# Patient Record
Sex: Male | Born: 1960 | Race: White | Hispanic: No | Marital: Married | Smoking: Current every day smoker
Health system: Southern US, Community
[De-identification: ages and names within clinical notes are randomized; demographics above are authoritative.]

## PROBLEM LIST (undated history)

## (undated) DIAGNOSIS — G4730 Sleep apnea, unspecified: Secondary | ICD-10-CM

## (undated) DIAGNOSIS — H269 Unspecified cataract: Secondary | ICD-10-CM

## (undated) DIAGNOSIS — I10 Essential (primary) hypertension: Secondary | ICD-10-CM

## (undated) DIAGNOSIS — J189 Pneumonia, unspecified organism: Secondary | ICD-10-CM

## (undated) DIAGNOSIS — E119 Type 2 diabetes mellitus without complications: Secondary | ICD-10-CM

## (undated) DIAGNOSIS — E782 Mixed hyperlipidemia: Secondary | ICD-10-CM

## (undated) DIAGNOSIS — G629 Polyneuropathy, unspecified: Secondary | ICD-10-CM

## (undated) DIAGNOSIS — Z87442 Personal history of urinary calculi: Secondary | ICD-10-CM

## (undated) DIAGNOSIS — K219 Gastro-esophageal reflux disease without esophagitis: Secondary | ICD-10-CM

## (undated) HISTORY — PX: EYE SURGERY: SHX253

## (undated) HISTORY — PX: ESOPHAGOGASTRODUODENOSCOPY: SHX1529

## (undated) HISTORY — DX: Unspecified cataract: H26.9

## (undated) HISTORY — DX: Essential (primary) hypertension: I10

## (undated) HISTORY — PX: COLONOSCOPY: SHX174

---

## 1998-03-29 ENCOUNTER — Ambulatory Visit (HOSPITAL_COMMUNITY): Admission: RE | Admit: 1998-03-29 | Discharge: 1998-03-29 | Payer: Self-pay | Admitting: Gastroenterology

## 1999-04-09 ENCOUNTER — Emergency Department (HOSPITAL_COMMUNITY): Admission: EM | Admit: 1999-04-09 | Discharge: 1999-04-09 | Payer: Self-pay | Admitting: Emergency Medicine

## 2000-12-28 ENCOUNTER — Ambulatory Visit (HOSPITAL_COMMUNITY): Admission: RE | Admit: 2000-12-28 | Discharge: 2000-12-28 | Payer: Self-pay | Admitting: Ophthalmology

## 2001-01-04 ENCOUNTER — Ambulatory Visit (HOSPITAL_COMMUNITY): Admission: RE | Admit: 2001-01-04 | Discharge: 2001-01-04 | Payer: Self-pay | Admitting: Ophthalmology

## 2001-10-17 ENCOUNTER — Encounter: Payer: Self-pay | Admitting: Emergency Medicine

## 2001-10-17 ENCOUNTER — Emergency Department (HOSPITAL_COMMUNITY): Admission: EM | Admit: 2001-10-17 | Discharge: 2001-10-17 | Payer: Self-pay | Admitting: Emergency Medicine

## 2001-11-02 ENCOUNTER — Encounter: Admission: RE | Admit: 2001-11-02 | Discharge: 2001-11-02 | Payer: Self-pay | Admitting: Urology

## 2001-11-02 ENCOUNTER — Encounter: Payer: Self-pay | Admitting: Urology

## 2001-11-03 ENCOUNTER — Ambulatory Visit (HOSPITAL_BASED_OUTPATIENT_CLINIC_OR_DEPARTMENT_OTHER): Admission: RE | Admit: 2001-11-03 | Discharge: 2001-11-03 | Payer: Self-pay | Admitting: Urology

## 2001-11-07 ENCOUNTER — Encounter: Payer: Self-pay | Admitting: Emergency Medicine

## 2001-11-07 ENCOUNTER — Emergency Department (HOSPITAL_COMMUNITY): Admission: EM | Admit: 2001-11-07 | Discharge: 2001-11-07 | Payer: Self-pay | Admitting: Emergency Medicine

## 2002-10-03 ENCOUNTER — Ambulatory Visit (HOSPITAL_COMMUNITY): Admission: RE | Admit: 2002-10-03 | Discharge: 2002-10-03 | Payer: Self-pay | Admitting: *Deleted

## 2004-11-07 ENCOUNTER — Ambulatory Visit (HOSPITAL_COMMUNITY): Admission: RE | Admit: 2004-11-07 | Discharge: 2004-11-07 | Payer: Self-pay | Admitting: Surgery

## 2004-11-07 ENCOUNTER — Ambulatory Visit (HOSPITAL_BASED_OUTPATIENT_CLINIC_OR_DEPARTMENT_OTHER): Admission: RE | Admit: 2004-11-07 | Discharge: 2004-11-07 | Payer: Self-pay | Admitting: Surgery

## 2006-04-07 ENCOUNTER — Emergency Department (HOSPITAL_COMMUNITY): Admission: EM | Admit: 2006-04-07 | Discharge: 2006-04-08 | Payer: Self-pay | Admitting: Emergency Medicine

## 2006-11-28 ENCOUNTER — Emergency Department (HOSPITAL_COMMUNITY): Admission: EM | Admit: 2006-11-28 | Discharge: 2006-11-28 | Payer: Self-pay | Admitting: Family Medicine

## 2006-12-01 ENCOUNTER — Encounter: Admission: RE | Admit: 2006-12-01 | Discharge: 2006-12-01 | Payer: Self-pay | Admitting: Sports Medicine

## 2007-01-18 ENCOUNTER — Ambulatory Visit: Payer: Self-pay | Admitting: Gastroenterology

## 2007-01-19 ENCOUNTER — Ambulatory Visit: Payer: Self-pay | Admitting: Gastroenterology

## 2007-01-19 ENCOUNTER — Encounter (INDEPENDENT_AMBULATORY_CARE_PROVIDER_SITE_OTHER): Payer: Self-pay | Admitting: Specialist

## 2007-10-23 ENCOUNTER — Emergency Department (HOSPITAL_COMMUNITY): Admission: EM | Admit: 2007-10-23 | Discharge: 2007-10-23 | Payer: Self-pay | Admitting: Family Medicine

## 2008-02-27 ENCOUNTER — Emergency Department (HOSPITAL_COMMUNITY): Admission: EM | Admit: 2008-02-27 | Discharge: 2008-02-27 | Payer: Self-pay | Admitting: Family Medicine

## 2008-11-26 ENCOUNTER — Inpatient Hospital Stay (HOSPITAL_COMMUNITY): Admission: EM | Admit: 2008-11-26 | Discharge: 2008-11-29 | Payer: Self-pay | Admitting: Family Medicine

## 2010-10-20 ENCOUNTER — Emergency Department (HOSPITAL_COMMUNITY)
Admission: EM | Admit: 2010-10-20 | Discharge: 2010-10-21 | Payer: Self-pay | Source: Home / Self Care | Admitting: Emergency Medicine

## 2010-10-21 ENCOUNTER — Inpatient Hospital Stay (HOSPITAL_COMMUNITY)
Admission: EM | Admit: 2010-10-21 | Discharge: 2010-10-24 | Payer: Self-pay | Source: Home / Self Care | Attending: Internal Medicine | Admitting: Internal Medicine

## 2010-10-28 LAB — DIFFERENTIAL
Basophils Absolute: 0 10*3/uL (ref 0.0–0.1)
Basophils Absolute: 0 10*3/uL (ref 0.0–0.1)
Basophils Relative: 0 % (ref 0–1)
Basophils Relative: 0 % (ref 0–1)
Eosinophils Absolute: 0.7 10*3/uL (ref 0.0–0.7)
Eosinophils Absolute: 0 10*3/uL (ref 0.0–0.7)
Eosinophils Relative: 5 % (ref 0–5)
Eosinophils Relative: 0 % (ref 0–5)
Lymphocytes Relative: 20 % (ref 12–46)
Lymphocytes Relative: 11 % — ABNORMAL LOW (ref 12–46)
Lymphs Abs: 2.9 10*3/uL (ref 0.7–4.0)
Lymphs Abs: 1.9 10*3/uL (ref 0.7–4.0)
Monocytes Absolute: 1.9 10*3/uL — ABNORMAL HIGH (ref 0.1–1.0)
Monocytes Absolute: 1.6 10*3/uL — ABNORMAL HIGH (ref 0.1–1.0)
Monocytes Relative: 13 % — ABNORMAL HIGH (ref 3–12)
Monocytes Relative: 9 % (ref 3–12)
Neutro Abs: 8.9 10*3/uL — ABNORMAL HIGH (ref 1.7–7.7)
Neutro Abs: 14.2 10*3/uL — ABNORMAL HIGH (ref 1.7–7.7)
Neutrophils Relative %: 62 % (ref 43–77)
Neutrophils Relative %: 80 % — ABNORMAL HIGH (ref 43–77)

## 2010-10-28 LAB — COMPREHENSIVE METABOLIC PANEL
ALT: 34 U/L (ref 0–53)
ALT: 23 U/L (ref 0–53)
ALT: 17 U/L (ref 0–53)
AST: 37 U/L (ref 0–37)
AST: 40 U/L — ABNORMAL HIGH (ref 0–37)
AST: 16 U/L (ref 0–37)
Albumin: 3.7 g/dL (ref 3.5–5.2)
Albumin: 2.9 g/dL — ABNORMAL LOW (ref 3.5–5.2)
Albumin: 2.5 g/dL — ABNORMAL LOW (ref 3.5–5.2)
Alkaline Phosphatase: 77 U/L (ref 39–117)
Alkaline Phosphatase: 59 U/L (ref 39–117)
Alkaline Phosphatase: 54 U/L (ref 39–117)
BUN: 6 mg/dL (ref 6–23)
BUN: 6 mg/dL (ref 6–23)
BUN: 8 mg/dL (ref 6–23)
CO2: 27 mEq/L (ref 19–32)
CO2: 23 mEq/L (ref 19–32)
CO2: 25 mEq/L (ref 19–32)
Calcium: 9 mg/dL (ref 8.4–10.5)
Calcium: 8.4 mg/dL (ref 8.4–10.5)
Calcium: 8.6 mg/dL (ref 8.4–10.5)
Chloride: 94 mEq/L — ABNORMAL LOW (ref 96–112)
Chloride: 98 mEq/L (ref 96–112)
Chloride: 102 mEq/L (ref 96–112)
Creatinine, Ser: 0.66 mg/dL (ref 0.4–1.5)
Creatinine, Ser: 0.9 mg/dL (ref 0.4–1.5)
Creatinine, Ser: 0.75 mg/dL (ref 0.4–1.5)
GFR calc Af Amer: >60 mL/min (ref >60)
GFR calc Af Amer: >60 mL/min (ref >60)
GFR calc Af Amer: >60 mL/min (ref >60)
GFR calc non Af Amer: >60 mL/min (ref >60)
GFR calc non Af Amer: >60 mL/min (ref >60)
GFR calc non Af Amer: >60 mL/min (ref >60)
Glucose, Bld: 165 mg/dL — ABNORMAL HIGH (ref 70–99)
Glucose, Bld: 175 mg/dL — ABNORMAL HIGH (ref 70–99)
Glucose, Bld: 159 mg/dL — ABNORMAL HIGH (ref 70–99)
Potassium: 4 mEq/L (ref 3.5–5.1)
Potassium: 4.5 mEq/L (ref 3.5–5.1)
Potassium: 4 mEq/L (ref 3.5–5.1)
Sodium: 131 mEq/L — ABNORMAL LOW (ref 135–145)
Sodium: 134 mEq/L — ABNORMAL LOW (ref 135–145)
Sodium: 135 mEq/L (ref 135–145)
Total Bilirubin: 1.5 mg/dL — ABNORMAL HIGH (ref 0.3–1.2)
Total Bilirubin: 2.3 mg/dL — ABNORMAL HIGH (ref 0.3–1.2)
Total Bilirubin: 0.9 mg/dL (ref 0.3–1.2)
Total Protein: 7.1 g/dL (ref 6.0–8.3)
Total Protein: 5.5 g/dL — ABNORMAL LOW (ref 6.0–8.3)
Total Protein: 6.1 g/dL (ref 6.0–8.3)

## 2010-10-28 LAB — CBC
HCT: 42.3 % (ref 39.0–52.0)
HCT: 44.8 % (ref 39.0–52.0)
HCT: 40.9 % (ref 39.0–52.0)
HCT: 37.7 % — ABNORMAL LOW (ref 39.0–52.0)
HCT: 36.9 % — ABNORMAL LOW (ref 39.0–52.0)
Hemoglobin: 16.3 g/dL (ref 13.0–17.0)
Hemoglobin: 16.3 g/dL (ref 13.0–17.0)
Hemoglobin: 13.8 g/dL (ref 13.0–17.0)
Hemoglobin: 12.7 g/dL — ABNORMAL LOW (ref 13.0–17.0)
Hemoglobin: 12.5 g/dL — ABNORMAL LOW (ref 13.0–17.0)
MCH: 35.4 pg — ABNORMAL HIGH (ref 26.0–34.0)
MCH: 33.5 pg (ref 26.0–34.0)
MCH: 31.2 pg (ref 26.0–34.0)
MCH: 31.4 pg (ref 26.0–34.0)
MCH: 31.6 pg (ref 26.0–34.0)
MCHC: 36.1 g/dL — ABNORMAL HIGH (ref 30.0–36.0)
MCHC: 36.4 g/dL — ABNORMAL HIGH (ref 30.0–36.0)
MCHC: 33.7 g/dL (ref 30.0–36.0)
MCHC: 33.7 g/dL (ref 30.0–36.0)
MCHC: 33.9 g/dL (ref 30.0–36.0)
MCV: 91.8 fL (ref 78.0–100.0)
MCV: 92.2 fL (ref 78.0–100.0)
MCV: 92.3 fL (ref 78.0–100.0)
MCV: 93.1 fL (ref 78.0–100.0)
MCV: 93.2 fL (ref 78.0–100.0)
Platelets: 188 10*3/uL (ref 150–400)
Platelets: 193 10*3/uL (ref 150–400)
Platelets: 154 10*3/uL (ref 150–400)
Platelets: 142 10*3/uL — ABNORMAL LOW (ref 150–400)
Platelets: 185 10*3/uL (ref 150–400)
RBC: 4.61 MIL/uL (ref 4.22–5.81)
RBC: 4.86 MIL/uL (ref 4.22–5.81)
RBC: 4.43 MIL/uL (ref 4.22–5.81)
RBC: 4.05 MIL/uL — ABNORMAL LOW (ref 4.22–5.81)
RBC: 3.96 MIL/uL — ABNORMAL LOW (ref 4.22–5.81)
RDW: 13.1 % (ref 11.5–15.5)
RDW: 13.3 % (ref 11.5–15.5)
RDW: 13.3 % (ref 11.5–15.5)
RDW: 13.8 % (ref 11.5–15.5)
RDW: 13.7 % (ref 11.5–15.5)
WBC: 14.4 10*3/uL — ABNORMAL HIGH (ref 4.0–10.5)
WBC: 17.7 10*3/uL — ABNORMAL HIGH (ref 4.0–10.5)
WBC: 17.8 10*3/uL — ABNORMAL HIGH (ref 4.0–10.5)
WBC: 15 10*3/uL — ABNORMAL HIGH (ref 4.0–10.5)
WBC: 11.4 10*3/uL — ABNORMAL HIGH (ref 4.0–10.5)

## 2010-10-28 LAB — URINALYSIS, ROUTINE W REFLEX MICROSCOPIC
Bilirubin Urine: NEGATIVE
Bilirubin Urine: NEGATIVE
Hgb urine dipstick: NEGATIVE
Hgb urine dipstick: NEGATIVE
Ketones, ur: NEGATIVE mg/dL
Ketones, ur: NEGATIVE mg/dL
Nitrite: NEGATIVE
Nitrite: NEGATIVE
Protein, ur: NEGATIVE mg/dL
Protein, ur: NEGATIVE mg/dL
Specific Gravity, Urine: 1.021 (ref 1.005–1.030)
Specific Gravity, Urine: 1.014 (ref 1.005–1.030)
Urine Glucose, Fasting: 500 mg/dL — AB
Urine Glucose, Fasting: NEGATIVE mg/dL
Urobilinogen, UA: 1 mg/dL (ref 0.0–1.0)
Urobilinogen, UA: 1 mg/dL (ref 0.0–1.0)
pH: 7 (ref 5.0–8.0)
pH: 6 (ref 5.0–8.0)

## 2010-10-28 LAB — HEPATIC FUNCTION PANEL
ALT: 23 U/L (ref 0–53)
AST: 27 U/L (ref 0–37)
Albumin: 3.6 g/dL (ref 3.5–5.2)
Alkaline Phosphatase: 81 U/L (ref 39–117)
Bilirubin, Direct: 0.7 mg/dL — ABNORMAL HIGH (ref 0.0–0.3)
Indirect Bilirubin: 1 mg/dL — ABNORMAL HIGH (ref 0.3–0.9)
Total Bilirubin: 1.7 mg/dL — ABNORMAL HIGH (ref 0.3–1.2)
Total Protein: 5.4 g/dL — ABNORMAL LOW (ref 6.0–8.3)

## 2010-10-28 LAB — LIPID PANEL
Cholesterol: 331 mg/dL — ABNORMAL HIGH (ref 0–200)
HDL: 37 mg/dL — ABNORMAL LOW (ref >39)
LDL Cholesterol: UNDETERMINED mg/dL (ref 0–99)
Total CHOL/HDL Ratio: 8.9 RATIO
Triglycerides: 1064 mg/dL — ABNORMAL HIGH (ref <150)
VLDL: UNDETERMINED mg/dL (ref 0–40)

## 2010-10-28 LAB — BASIC METABOLIC PANEL
BUN: 8 mg/dL (ref 6–23)
CO2: 24 mEq/L (ref 19–32)
Calcium: 8.7 mg/dL (ref 8.4–10.5)
Chloride: 101 mEq/L (ref 96–112)
Creatinine, Ser: 0.99 mg/dL (ref 0.4–1.5)
GFR calc Af Amer: >60 mL/min (ref >60)
GFR calc non Af Amer: >60 mL/min (ref >60)
Glucose, Bld: 233 mg/dL — ABNORMAL HIGH (ref 70–99)
Potassium: 5.3 mEq/L — ABNORMAL HIGH (ref 3.5–5.1)
Sodium: 135 mEq/L (ref 135–145)

## 2010-10-28 LAB — URINE CULTURE
Colony Count: NO GROWTH
Culture  Setup Time: 201201082346
Culture: NO GROWTH

## 2010-10-28 LAB — LIPASE, BLOOD
Lipase: 294 U/L — ABNORMAL HIGH (ref 11–59)
Lipase: 349 U/L — ABNORMAL HIGH (ref 11–59)
Lipase: 93 U/L — ABNORMAL HIGH (ref 11–59)
Lipase: 26 U/L (ref 11–59)
Lipase: 30 U/L (ref 11–59)

## 2010-10-28 LAB — POTASSIUM: Potassium: 4.7 mEq/L (ref 3.5–5.1)

## 2010-12-09 ENCOUNTER — Emergency Department (HOSPITAL_COMMUNITY): Payer: Self-pay

## 2010-12-09 ENCOUNTER — Inpatient Hospital Stay (HOSPITAL_COMMUNITY)
Admission: EM | Admit: 2010-12-09 | Discharge: 2010-12-13 | DRG: 440 | Disposition: A | Discharge status: Home / Self Care | Payer: Self-pay | Attending: Internal Medicine | Admitting: Internal Medicine

## 2010-12-09 DIAGNOSIS — F1010 Alcohol abuse, uncomplicated: Secondary | ICD-10-CM | POA: Diagnosis present

## 2010-12-09 DIAGNOSIS — F17200 Nicotine dependence, unspecified, uncomplicated: Secondary | ICD-10-CM | POA: Diagnosis present

## 2010-12-09 DIAGNOSIS — IMO0001 Reserved for inherently not codable concepts without codable children: Secondary | ICD-10-CM | POA: Diagnosis present

## 2010-12-09 DIAGNOSIS — K859 Acute pancreatitis without necrosis or infection, unspecified: Principal | ICD-10-CM | POA: Diagnosis present

## 2010-12-09 DIAGNOSIS — D72829 Elevated white blood cell count, unspecified: Secondary | ICD-10-CM | POA: Diagnosis present

## 2010-12-09 DIAGNOSIS — K7689 Other specified diseases of liver: Secondary | ICD-10-CM | POA: Diagnosis present

## 2010-12-09 DIAGNOSIS — E781 Pure hyperglyceridemia: Secondary | ICD-10-CM | POA: Diagnosis present

## 2010-12-09 DIAGNOSIS — K219 Gastro-esophageal reflux disease without esophagitis: Secondary | ICD-10-CM | POA: Diagnosis present

## 2010-12-09 DIAGNOSIS — E876 Hypokalemia: Secondary | ICD-10-CM | POA: Diagnosis present

## 2010-12-09 LAB — URINALYSIS, ROUTINE W REFLEX MICROSCOPIC
Bilirubin Urine: NEGATIVE
Hgb urine dipstick: NEGATIVE
Leukocytes, UA: NEGATIVE
Specific Gravity, Urine: 1.042 — ABNORMAL HIGH (ref 1.005–1.030)
Urine Glucose, Fasting: >1000 mg/dL — AB
Urobilinogen, UA: 0.2 mg/dL (ref 0.0–1.0)
pH: 6.5 (ref 5.0–8.0)

## 2010-12-09 LAB — DIFFERENTIAL
Basophils Absolute: 0.2 10*3/uL — ABNORMAL HIGH (ref 0.0–0.1)
Eosinophils Absolute: 0.3 10*3/uL (ref 0.0–0.7)
Monocytes Absolute: 0.9 10*3/uL (ref 0.1–1.0)
Monocytes Relative: 6 % (ref 3–12)
Neutro Abs: 11.5 10*3/uL — ABNORMAL HIGH (ref 1.7–7.7)
Neutrophils Relative %: 74 % (ref 43–77)

## 2010-12-09 LAB — COMPREHENSIVE METABOLIC PANEL
AST: 38 U/L — ABNORMAL HIGH (ref 0–37)
Alkaline Phosphatase: 149 U/L — ABNORMAL HIGH (ref 39–117)
BUN: 10 mg/dL (ref 6–23)
GFR calc Af Amer: >60 mL/min (ref >60)
Glucose, Bld: 589 mg/dL (ref 70–99)
Total Bilirubin: 1.2 mg/dL (ref 0.3–1.2)

## 2010-12-09 LAB — CBC
Hemoglobin: 16.3 g/dL (ref 13.0–17.0)
MCHC: 36 g/dL (ref 30.0–36.0)
MCV: 89.5 fL (ref 78.0–100.0)
Platelets: 262 10*3/uL (ref 150–400)
WBC: 15.5 10*3/uL — ABNORMAL HIGH (ref 4.0–10.5)

## 2010-12-09 LAB — URINE MICROSCOPIC-ADD ON

## 2010-12-09 LAB — GLUCOSE, CAPILLARY: Glucose-Capillary: 277 mg/dL — ABNORMAL HIGH (ref 70–99)

## 2010-12-10 LAB — COMPREHENSIVE METABOLIC PANEL
ALT: 30 U/L (ref 0–53)
Albumin: 3.4 g/dL — ABNORMAL LOW (ref 3.5–5.2)
Alkaline Phosphatase: 97 U/L (ref 39–117)
BUN: 7 mg/dL (ref 6–23)
CO2: 27 mEq/L (ref 19–32)
GFR calc non Af Amer: >60 mL/min (ref >60)
Potassium: 4.1 mEq/L (ref 3.5–5.1)
Total Bilirubin: 1.8 mg/dL — ABNORMAL HIGH (ref 0.3–1.2)
Total Protein: 5.4 g/dL — ABNORMAL LOW (ref 6.0–8.3)

## 2010-12-10 LAB — DIFFERENTIAL
Basophils Absolute: 0 10*3/uL (ref 0.0–0.1)
Eosinophils Absolute: 0.2 10*3/uL (ref 0.0–0.7)
Eosinophils Relative: 2 % (ref 0–5)
Lymphocytes Relative: 17 % (ref 12–46)
Lymphs Abs: 2.3 10*3/uL (ref 0.7–4.0)
Monocytes Absolute: 1.1 10*3/uL — ABNORMAL HIGH (ref 0.1–1.0)
Neutro Abs: 10 10*3/uL — ABNORMAL HIGH (ref 1.7–7.7)
Neutrophils Relative %: 74 % (ref 43–77)

## 2010-12-10 LAB — GLUCOSE, CAPILLARY
Glucose-Capillary: 262 mg/dL — ABNORMAL HIGH (ref 70–99)
Glucose-Capillary: 249 mg/dL — ABNORMAL HIGH (ref 70–99)
Glucose-Capillary: 261 mg/dL — ABNORMAL HIGH (ref 70–99)

## 2010-12-10 LAB — HEMOGLOBIN A1C
Hgb A1c MFr Bld: 10.6 % — ABNORMAL HIGH (ref <5.7)
Mean Plasma Glucose: 258 mg/dL — ABNORMAL HIGH (ref <117)

## 2010-12-10 LAB — CBC
HCT: 40.4 % (ref 39.0–52.0)
Hemoglobin: 14.1 g/dL (ref 13.0–17.0)
RBC: 4.39 MIL/uL (ref 4.22–5.81)
WBC: 13.6 10*3/uL — ABNORMAL HIGH (ref 4.0–10.5)

## 2010-12-10 LAB — LIPID PANEL
Cholesterol: 415 mg/dL — ABNORMAL HIGH (ref 0–200)
LDL Cholesterol: UNDETERMINED mg/dL (ref 0–99)
Triglycerides: 2291 mg/dL — ABNORMAL HIGH (ref <150)

## 2010-12-10 LAB — LIPASE, BLOOD: Lipase: 636 U/L — ABNORMAL HIGH (ref 11–59)

## 2010-12-11 LAB — BASIC METABOLIC PANEL
BUN: 4 mg/dL — ABNORMAL LOW (ref 6–23)
Chloride: 104 mEq/L (ref 96–112)
Creatinine, Ser: 0.78 mg/dL (ref 0.4–1.5)
GFR calc Af Amer: >60 mL/min (ref >60)
GFR calc non Af Amer: >60 mL/min (ref >60)
Potassium: 3.4 mEq/L — ABNORMAL LOW (ref 3.5–5.1)

## 2010-12-11 LAB — GLUCOSE, CAPILLARY
Glucose-Capillary: 168 mg/dL — ABNORMAL HIGH (ref 70–99)
Glucose-Capillary: 173 mg/dL — ABNORMAL HIGH (ref 70–99)
Glucose-Capillary: 176 mg/dL — ABNORMAL HIGH (ref 70–99)
Glucose-Capillary: 217 mg/dL — ABNORMAL HIGH (ref 70–99)

## 2010-12-11 LAB — CBC
MCV: 91.8 fL (ref 78.0–100.0)
Platelets: 169 10*3/uL (ref 150–400)
RBC: 4.15 MIL/uL — ABNORMAL LOW (ref 4.22–5.81)
RDW: 13.1 % (ref 11.5–15.5)
WBC: 14.2 10*3/uL — ABNORMAL HIGH (ref 4.0–10.5)

## 2010-12-12 LAB — COMPREHENSIVE METABOLIC PANEL
Alkaline Phosphatase: 65 U/L (ref 39–117)
BUN: 5 mg/dL — ABNORMAL LOW (ref 6–23)
Calcium: 9 mg/dL (ref 8.4–10.5)
Creatinine, Ser: 0.75 mg/dL (ref 0.4–1.5)
Glucose, Bld: 154 mg/dL — ABNORMAL HIGH (ref 70–99)
Total Protein: 6.1 g/dL (ref 6.0–8.3)

## 2010-12-12 LAB — GLUCOSE, CAPILLARY
Glucose-Capillary: 128 mg/dL — ABNORMAL HIGH (ref 70–99)
Glucose-Capillary: 137 mg/dL — ABNORMAL HIGH (ref 70–99)
Glucose-Capillary: 176 mg/dL — ABNORMAL HIGH (ref 70–99)

## 2010-12-12 LAB — CBC
HCT: 37.6 % — ABNORMAL LOW (ref 39.0–52.0)
MCH: 31.3 pg (ref 26.0–34.0)
MCHC: 34.6 g/dL (ref 30.0–36.0)
MCV: 90.4 fL (ref 78.0–100.0)
Platelets: 180 10*3/uL (ref 150–400)
RDW: 12.9 % (ref 11.5–15.5)

## 2010-12-12 LAB — MAGNESIUM: Magnesium: 2 mg/dL (ref 1.5–2.5)

## 2010-12-12 LAB — LIPASE, BLOOD: Lipase: 30 U/L (ref 11–59)

## 2010-12-13 LAB — LIPID PANEL
Cholesterol: 269 mg/dL — ABNORMAL HIGH (ref 0–200)
HDL: 38 mg/dL — ABNORMAL LOW (ref >39)
Total CHOL/HDL Ratio: 7.1 RATIO

## 2010-12-13 LAB — CBC
HCT: 39.7 % (ref 39.0–52.0)
MCH: 31.1 pg (ref 26.0–34.0)
MCV: 90 fL (ref 78.0–100.0)
RDW: 12.7 % (ref 11.5–15.5)
WBC: 11.2 10*3/uL — ABNORMAL HIGH (ref 4.0–10.5)

## 2011-01-02 NOTE — H&P (Signed)
Matthew Mckenzie, BAUGH NO.:  [id number]  MEDICAL RECORD NO.:  [id number]           PATIENT TYPE:  E  LOCATION:  WLED                         FACILITY:  [facility]  PHYSICIAN:  Massie Maroon, MD        DATE OF BIRTH:  July 13, 1961  DATE OF ADMISSION:  12/09/2010 DATE OF DISCHARGE:                             HISTORY & PHYSICAL   CHIEF COMPLAINT:  Abdominal pain.  HISTORY OF PRESENT ILLNESS:  This 50 year old male with a history of hypertriglyceridemia, previous pancreatitis, complains of abdominal pain in the epigastric area starting this morning.  It was worse tonight and so, he presented to the ER.  He describes as a sharp pain without radiation in the epigastric area.  The patient states this pain is similar to his prior pancreatitis pain.  The patient denies any fever, chills, nausea, vomiting, diarrhea, bright red blood per rectum or black stool.  There have been no new medications other than 2 cholesterol medications.  The patient does admit to drinking Bourbon the night before.  The patient was brought to the ED and evaluate and found to have a lipase of 1515.  His liver function was slightly elevated with an AST of 38, ALT of 23, alk phos 1.9, total bilirubin 1.2.  He does have a history of gallbladder sludge on prior ultrasound and so will be receiving a ultrasound.  His glucose is also high at 589.  There was no evidence of any DKA, however.  The patient will be admitted for pancreatitis secondary to hyperglycemia and hypertriglyceridemia, rule out gallstone pancreatitis.  PAST MEDICAL HISTORY: 1. Pancreatitis. 2. Hypertriglyceridemia with triglyceride level of approximately 1000. 3. Hyperglycemia, diabetes type 2. 4. Fatty liver. 5. GERD. 6. Left inguinal hernia. 7. Restless legs. 8. Nephrolithiasis.  PAST SURGICAL HISTORY: 1. Hemorrhoidectomy. 2. Fissurectomy with lateral internal sphincterotomy. 3. Cystoscopy and lithotripsy with JJ stent  placement. 4. Also appendectomy. 5. Cataract surgery, also Lasix surgery.  SOCIAL HISTORY:  The patient smokes one-pack per day times 25 to 30 years.  He occasionally drinks.  He denies any drug use.  He is self- employed, previously he worked on boats.  He is married and lives with his wife.  He is accompanied by his sister today.  FAMILY HISTORY:  The patient's mother is alive at age 71 and is healthy. Father is alive at age 13 and also healthy with the exception of hyperlipidemia and pancreatitis.  One healthy sister and three healthy offspring.  ALLERGIES:  PERCOCET causes itching.  MEDICATIONS:  See med rec, 1. Fish oil 1 p.o. daily. 2. Pravastatin 40 mg p.o. q.h.s. 3. Gemfibrozil 600 mg p.o. b.i.d. 4. Prilosec 20 mg p.o. daily. 5. Phenergan 12.5 mg p.o. q.6h. p.r.n.  REVIEW OF SYSTEMS:  Negative for all 10 organ systems except for pertinent positives as stated above.  PHYSICAL EXAMINATION:  GENERAL:  Temperature 98.1, pulse 90, blood pressure 130/94, pulse ox is 98% on room air. HEENT:  Anicteric.  EOMI.  No nystagmus.  Pupils 1.5 mm, symmetric, direct consensual, near reflex, and intact.  Mucous membranes moist. NECK:  No JVD, no bruit, no thyromegaly, no adenopathy. HEART:  Regular rate and rhythm.  S1 and S2.  No murmurs, gallops, or rubs. LUNGS:  Clear to auscultation bilaterally. ABDOMEN:  Soft, nontender, nondistended.  Positive bowel sounds. EXTREMITIES:  No cyanosis, clubbing, or edema. SKIN:  No rash.  Negative Cullen's sign. LYMPH NODES:  No adenopathy. NEURO EXAM:  Nonfocal.  LABORATORY DATA:  WBC 15.5, hemoglobin 16.3, platelet count is 262,000. Urinalysis negative.  Sodium 129, potassium 4.2, glucose 589, BUN 10, creatinine 0.77, AST 38 (high), ALT 23, alk phos 149 (high), total bilirubin 1.2, lipase is 1515.  ASSESSMENT/PLAN: 1. PANCREATITIS (ACUTE):  The patient made n.p.o.  We will hydrate     aggressively with normal saline and control pain with  Dilaudid.  We     will check a right upper quadrant ultrasound in light of the fact     that there is abnormal liver function tests to rule out gallstone     pancreatitis. 2. ABNORMAL LIVER FUNCTION TESTS:  Check a right upper quadrant     ultrasound.  Consider checking acute hepatitis panel. 3. HYPERGLYCEMIA, NEW ONSET DIABETES, TYPE 2:  Fingerstick blood     sugars q.4h. and NovoLog sensitive sliding scale. 4. HYPERLIPIDEMIA:  Continue gemfibrozil.  Discontinue pravastatin for     now in light of abnormal liver function testing. 5. GERD:  Continue Prilosec. 6. TOBACCO DEPENDENCE:  The patient was encouraged to stop smoking.     Ten minutes spent on smoking cessation counseling. 7. DVT prophylaxis, SCDs.    Massie Maroon, MD    JYK/MEDQ  D:  12/09/2010  T:  12/09/2010  Job:  [id number]  Electronically Signed by Pearson Grippe MD on 01/02/2011 03:33:00 PM

## 2011-01-18 NOTE — Discharge Summary (Signed)
Matthew Mckenzie, Matthew Mckenzie                  ACCOUNT NO.:  [id number]  MEDICAL RECORD NO.:  [id number]           PATIENT TYPE:  I  LOCATION:  1419                         [facility]ACILITY[facility]  [facility]  PHYSICIAN:  Richarda Overlie, MD       DATE OF BIRTH:  1961-06-24  DATE OF ADMISSION:  12/09/2010 DATE OF DISCHARGE:  12/13/2010                        DISCHARGE SUMMARY - REFERRING   PRIMARY CARE PHYSICIAN:  Unassigned.  CHIEF COMPLAINT:  Abdominal pain.  DISCHARGE DIAGNOSES: 1. Acute pancreatitis. 2. Hypertriglyceridemia. 3. Leukocytosis in the setting of acute pancreatitis. 4. Alcohol abuse. 5. Nicotine dependence. 6. New onset diabetes with a hemoglobin A1c of 10.6.  DISCHARGE MEDICATIONS:  Lantus 15 units subcu daily, metformin 500 mg p.o. twice daily, nicotine patch with taper, fish oil 1 capsule p.o. daily, gemfibrozil 600 mg p.o. twice daily, Phenergan 12.5 1 tablet p.o. q.6 h. p.r.n., pravastatin 40 mg p.o. daily, Prilosec 20 mg p.o. daily.  SUBJECTIVE:  This is a 50 year old male with a history of hypertriglyceridemia previous episodes of pancreatitis who presents with epigastric pain associated with nausea and vomiting.  The patient denied any diarrhea, bright red blood per rectum.  The patient was found to have an elevated lipase of 1515, mildly elevated LFTs with an AST of 38, ALT of 23, alk phos of 1.9, T-bili of 1.2.  The patient was found to also have a high sugar of 589, but there was no evidence of DKA.  HOSPITAL COURSE: 1. Acute pancreatitis, likely in the setting of hypertriglyceridemia.     Initial lipid panel showed that his triglycerides were elevated at     2291.  Repeat triglyceride level was 605.  The patient has been on     gemfibrozil and pravastatin and most likely the acute pancreatitis     was secondary to hypertriglyceridemia in the setting of his     untreated diabetes. 2. Diabetes type 2.  The patient has a hemoglobin A1c of 10.6.  Given     his significantly  elevated hemoglobin A1c, the patient has been     started on Lantus at bedtime with insulin teaching done before     discharge as well as metformin, which will also help him with his     weight loss.  He was recommended to get a repeat hemoglobin A1c in     3 months.  LFTs and CK checked in about 6 weeks.  The patient will     get outpatient diabetes education and nutrition consult will also     be obtained prior to the patient's discharge.  DISPOSITION:  The patient's diet has been advanced to mechanical soft diet and he has tolerated this well.  He has been counseled about alcohol cessation, nicotine cessation.  He will follow up with [facility].  We will review his medications and provide any assistance that began from the hospital prior to his discharge and the plan was discussed with him.     Richarda Overlie, MD     NA/MEDQ  D:  12/13/2010  T:  12/13/2010  Job:  [id number]  Electronically Signed by Richarda Overlie MD  on 01/18/2011 08:14:56 PM

## 2011-01-28 LAB — URINALYSIS, ROUTINE W REFLEX MICROSCOPIC
Bilirubin Urine: NEGATIVE
Glucose, UA: NEGATIVE mg/dL
Hgb urine dipstick: NEGATIVE
Hgb urine dipstick: NEGATIVE
Specific Gravity, Urine: 1.028 (ref 1.005–1.030)
Specific Gravity, Urine: 1.016 (ref 1.005–1.030)
pH: 6 (ref 5.0–8.0)
pH: 7 (ref 5.0–8.0)

## 2011-01-28 LAB — HEMOGLOBIN A1C
Hgb A1c MFr Bld: 5.7 % (ref 4.6–6.1)
Mean Plasma Glucose: 117 mg/dL

## 2011-01-28 LAB — COMPREHENSIVE METABOLIC PANEL
ALT: 18 U/L (ref 0–53)
ALT: 23 U/L (ref 0–53)
AST: 34 U/L (ref 0–37)
AST: 22 U/L (ref 0–37)
Alkaline Phosphatase: 51 U/L (ref 39–117)
CO2: 23 mEq/L (ref 19–32)
CO2: 26 mEq/L (ref 19–32)
Calcium: 8.6 mg/dL (ref 8.4–10.5)
Calcium: 8.6 mg/dL (ref 8.4–10.5)
Calcium: 8.7 mg/dL (ref 8.4–10.5)
Chloride: 103 mEq/L (ref 96–112)
Chloride: 103 mEq/L (ref 96–112)
Creatinine, Ser: 1.08 mg/dL (ref 0.4–1.5)
GFR calc Af Amer: >60 mL/min (ref >60)
GFR calc non Af Amer: >60 mL/min (ref >60)
GFR calc non Af Amer: >60 mL/min (ref >60)
Glucose, Bld: 125 mg/dL — ABNORMAL HIGH (ref 70–99)
Glucose, Bld: 149 mg/dL — ABNORMAL HIGH (ref 70–99)
Glucose, Bld: 114 mg/dL — ABNORMAL HIGH (ref 70–99)
Potassium: 3.8 mEq/L (ref 3.5–5.1)
Sodium: 135 mEq/L (ref 135–145)
Sodium: 137 mEq/L (ref 135–145)
Total Bilirubin: 1.6 mg/dL — ABNORMAL HIGH (ref 0.3–1.2)
Total Bilirubin: 1.8 mg/dL — ABNORMAL HIGH (ref 0.3–1.2)
Total Protein: 6.3 g/dL (ref 6.0–8.3)

## 2011-01-28 LAB — CBC
HCT: 38.8 % — ABNORMAL LOW (ref 39.0–52.0)
HCT: 40.9 % (ref 39.0–52.0)
HCT: 37.7 % — ABNORMAL LOW (ref 39.0–52.0)
Hemoglobin: 13.7 g/dL (ref 13.0–17.0)
Hemoglobin: 15.5 g/dL (ref 13.0–17.0)
Hemoglobin: 13.2 g/dL (ref 13.0–17.0)
Hemoglobin: 13.4 g/dL (ref 13.0–17.0)
MCHC: 35.4 g/dL (ref 30.0–36.0)
MCHC: 35.4 g/dL (ref 30.0–36.0)
MCHC: 35.4 g/dL (ref 30.0–36.0)
MCV: 94.5 fL (ref 78.0–100.0)
MCV: 94.6 fL (ref 78.0–100.0)
MCV: 94.5 fL (ref 78.0–100.0)
Platelets: 195 10*3/uL (ref 150–400)
Platelets: 185 10*3/uL (ref 150–400)
RBC: 4.11 MIL/uL — ABNORMAL LOW (ref 4.22–5.81)
RBC: 4.63 MIL/uL (ref 4.22–5.81)
RBC: 3.96 MIL/uL — ABNORMAL LOW (ref 4.22–5.81)
RBC: 3.99 MIL/uL — ABNORMAL LOW (ref 4.22–5.81)
RDW: 12.6 % (ref 11.5–15.5)
RDW: 12.6 % (ref 11.5–15.5)
WBC: 13.9 10*3/uL — ABNORMAL HIGH (ref 4.0–10.5)
WBC: 17.1 10*3/uL — ABNORMAL HIGH (ref 4.0–10.5)
WBC: 10.7 10*3/uL — ABNORMAL HIGH (ref 4.0–10.5)

## 2011-01-28 LAB — DIFFERENTIAL
Basophils Absolute: 0.1 10*3/uL (ref 0.0–0.1)
Basophils Relative: 1 % (ref 0–1)
Eosinophils Absolute: 0.1 10*3/uL (ref 0.0–0.7)
Eosinophils Absolute: 0.5 10*3/uL (ref 0.0–0.7)
Eosinophils Relative: 4 % (ref 0–5)
Lymphocytes Relative: 15 % (ref 12–46)
Lymphs Abs: 2.9 10*3/uL (ref 0.7–4.0)
Lymphs Abs: 1.6 10*3/uL (ref 0.7–4.0)
Monocytes Absolute: 0.9 10*3/uL (ref 0.1–1.0)
Monocytes Relative: 9 % (ref 3–12)
Neutrophils Relative %: 76 % (ref 43–77)

## 2011-01-28 LAB — LIPID PANEL: Cholesterol: 537 mg/dL — ABNORMAL HIGH (ref 0–200)

## 2011-01-28 LAB — TSH: TSH: 0.57 u[IU]/mL (ref 0.350–4.500)

## 2011-01-28 LAB — LIPASE, BLOOD
Lipase: 293 U/L — ABNORMAL HIGH (ref 11–59)
Lipase: 43 U/L (ref 11–59)

## 2011-02-25 NOTE — Discharge Summary (Signed)
Matthew Mckenzie, Matthew Mckenzie NO.:  [id number]   MEDICAL RECORD NO.:  [id number]          PATIENT TYPE:  INP   LOCATION:  5121                         FACILITY:  [facility]   PHYSICIAN:  Richarda Overlie, MD       DATE OF BIRTH:  [birth year]   DATE OF ADMISSION:  11/25/2008  DATE OF DISCHARGE:  11/29/2008                               DISCHARGE SUMMARY   FINAL DISCHARGE DIAGNOSES:  1. Acute pancreatitis.  2. Hypertriglyceridemia.   SUBJECTIVE:  This is a 50 year old Caucasian male who presented to the  ER with acute onset of abdominal pain.  The patient reportedly was  having whiskey over the weekend and developed severe 10/10 colicky  abdominal pain associated with nausea.  No episodes of vomiting.  The  patient was found to have an elevated lipase of 293 on his initial blood  work.  Symptoms were consistent with acute pancreatitis and he was  admitted for management of the above.  The patient also had an  ultrasound of the abdomen done that showed small amount of gallbladder  sludge, fatty liver.  The patient was found to have hypertriglyceridemia  with a triglyceride level of 3356 and a total cholesterol of 536.  The  patient's acute pancreatitis was thought to be secondary to  hypertriglyceridemia.  The patient was supposed to be on TriCor but has  been noncompliant because of loss of his insurance.  During his stay,  the patient's diet was advanced from clear liquid to a full liquid diet  which he tolerated well.  He was hydrated aggressively with IV fluids  and was found to be hemodynamically stable on the day of discharge.  The  patient is advised to continue with low fat, low cholesterol diet and  drink plenty of fluids.   FOLLOWUP CONCERNS:  The patient is to follow up with [facility] in 5 to 7 days.  If he is unable to see what primary care  physician at [facility] because of loss of insurance,  then he can call [facility], the  number of which has been provided  prior to discharge.   DISCHARGE MEDICATIONS:  1. Nicotine patch 40 mg per day x6 weeks and 7 mg a day x2 weeks.  2. Prilosec 20 mg p.o. daily.  3. Lopid 600 mg p.o. q.12.  4. Senna.  5. Colace 1 tablet p.o. q.12 hours.  Hold for diarrhea.  .6  Zofran 4 mg p.o. q.6 hours p.r.n. nausea.      Richarda Overlie, MD  Electronically Signed     NA/MEDQ  D:  11/29/2008  T:  11/29/2008  Job:  [id number]

## 2011-02-25 NOTE — H&P (Signed)
NAMELANKFORD, GUTZMER NO.:  [id number]   MEDICAL RECORD NO.:  [id number]          PATIENT TYPE:  INP   LOCATION:  5121                         FACILITY:  [facility]   PHYSICIAN:  Vania Rea, M.D. DATE OF BIRTH:  14-Aug-1961   DATE OF ADMISSION:  11/25/2008  DATE OF DISCHARGE:                              HISTORY & PHYSICAL   PRIMARY CARE PHYSICIAN:  [facility]   CHIEF COMPLAINT:  Abdominal pain.   HISTORY OF PRESENT ILLNESS:  This is a 50 year old Caucasian gentleman  with a history of GERD and who drinks a half of a bottle of whiskey  episodically, has been drinking more recently and presents with 2 days  of acute abdominal pain associated with nausea.  The patient describes  the pain as severe, 10/10, colicky, lasting about 2 minutes at a time,  radiating through to his back, associated with nausea but not frank  vomiting.  The patient has had no diaphoresis and no fevers but has been  having chills.  He denies any history of gallstones.  He denies any  history of new medications, other than his usual Prilosec and Maalox.  He has been having no bloody stools or change in his stool color.  He  has been  having no diarrhea.   PAST MEDICAL HISTORY:  1. GERD.  2. Hyperlipidemia.  3. History of kidney stones.   MEDICATIONS:  1. Prilosec p.r.n.  2. Maalox p.r.n.   ALLERGIES:  No known drug allergies.   SOCIAL HISTORY:  Smokes about 1-1/2 packs per day and drinks about a  half pint of whiskey, he says every 2 weeks.  Denies illicit drug use.  Works as a Marine scientist.   FAMILY HISTORY:  There is no immediate family history of medical illness  at all according to this gentleman.   REVIEW OF SYSTEMS:  A 10-point review of systems other than noted above  is not significant.   PHYSICAL EXAMINATION:  A well-built middle aged Caucasian gentleman  lying in bed, looks uncomfortable.  VITAL SIGNS:  Temperature 99.3, pulse 85, respirations 16  and blood  pressure 114/49.  He is saturating at 91% in room air.  Pupils are round and equal.  Mucous membranes are pink and anicteric. He  is mildly dehydrated.  No cervical lymphadenopathy or thyromegaly.  No  jugular venous distention.  CHEST:  Clear to auscultation bilaterally.  CARDIOVASCULAR:  Regular rhythm.  ABDOMEN:  Obese.  There is mild epigastric tenderness.  Bowel sounds  diminished.  EXTREMITIES:  Without edema.  He has no bone or joint deformities.  CENTRAL NERVOUS SYSTEM:  Cranial nerves II through XII are intact. He  has no focal neurologic deficit.   LABORATORY DATA:  He has a marked leukocytosis with a white count of  17.9, labs otherwise unremarkable.  Hemoglobin is 15.5, platelet count  187,000.  He has 76% neutrophils with absolute neutrophil count of 13.7.  His serum chemistries are remarkable for a glucose of 125, BUN 7,  creatinine 1.04, calcium 8.6, total protein 6.5, albumin 3.7, total  bilirubin  1.8, AST 39, ALT 18.  Acute abdominal series shows no evidence  of obstruction, no free air.  There is moderate stool burden.  Serum  lipase is 293 with upper limits of normal being 59.   ASSESSMENT:  1. Acute pancreatitis.  2. History of gastroesophageal reflux disease.  3. History of hyperlipidemia.  4. History of gallstones.  5. Alcohol abuse.  6. Tobacco abuse.   PLAN:  Will admit this gentleman for IV hydration, pain control.  Will  check his serum lipids and get an ultrasound to evaluate for other risk  factors for acute pancreatitis.  His liver functions are not typical of  someone with alcoholic liver disease.  He is usually seen by [facility]  and if he fails to settle he can possibly be evaluated by them.  Otherwise follow with [facility] as an outpatient.  Other plans as per  orders.      Vania Rea, M.D.  Electronically Signed     LC/MEDQ  D:  11/26/2008  T:  11/26/2008  Job:  [id number]

## 2011-02-28 NOTE — Op Note (Signed)
[facility]  Patient:    Matthew Mckenzie, Matthew Mckenzie Visit Number: [id number] MRN: [id number]          Service Type: NES Location: NESC Attending Physician:  Thermon Leyland Dictated by:   Barron Alvine, M.D. Proc. Date: 11/03/01 Admit Date:  10/14/2001 Discharge Date: 10/14/2001                             Operative Report  PREOPERATIVE DIAGNOSIS:  Left mid ureteral calculus.  POSTOPERATIVE DIAGNOSIS:  Left mid ureteral calculus.  PROCEDURE: 1. Cystoscopy. 2. Retrograde pyelography. 3. Ureteroscopy. 4. Holmium laser lithotripsy. 5. Double J stent placement.  SURGEON:  Barron Alvine, M.D.  ANESTHESIA:  General.  INDICATIONS:  Matthew Mckenzie is a 50 year old male. He has had nephrolithiasis in the past. Approximately two weeks ago, he developed some left flank pain and had a CT scan. This showed what appeared to be a 5 mm proximal stone. He has continued to have intermittent discomfort and saw [place] recently in the office. A KUB showed migration of the stone to the mid ureter and it measured approximately 6 mm in size. Since that patient had only had partial migration of the stone and had had two weeks of pain, we offered him the possibility of intervention. After discussing the pros and cons of treatment versus continued observation, he elected to have intervention. Given the location of the stone, we recommended ureteroscopy with holmium laser lithotripsy as having the best chance of success. The patient appeared to understand the complications and risk and full informed consent was obtained.  TECHNIQUE AND FINDINGS:  The patient was brought to the operating room where he had successful induction of general anesthesia. He was placed in the male lithotomy position and prepped and draped in the usual manner. Cystoscopy was unremarkable. His left ureteral orifice was quite tight. Retrograde pyelogram confirmed the filling defect near the junction of the mid  and distal ureter. A guidewire was placed beyond the stone without difficulty. He used a one-step dilator to gently dilate the distal ureter. A mini ureteroscope was then inserted without difficulty. The stone was encountered and felt to be too large to basket extract. A holmium laser fiber was used to break the stone into numerous 1 to 2-mm fragments. No complications or problems occurred. Because of the need for slight ureteral dilation, we felt that a Double J stent placement for 48 to 72 hours was indicated. This was placed over the guidewire with direct visual as well as fluoroscopic guidance. A 7-French 26 cm stent was placed with a dangle string. The patient appeared to tolerate the procedure well. There were no obvious complications and the patient was brought to the recovery room in stable condition. Dictated by:   Barron Alvine, M.D. Attending Physician:  Thermon Leyland DD:  11/03/01 TD:  11/04/01 Job: [id number]

## 2011-02-28 NOTE — Assessment & Plan Note (Signed)
Matthew Mckenzie                         Matthew Mckenzie   NAME:Matthew Mckenzie, Matthew Mckenzie                         MRN:          [id number]  DATE:01/18/2007                            DOB:          [birth year]    REFERRING PHYSICIAN:  Shon Hale Mckenzie   REASON FOR REFERRAL:  GERD and epigastric pain.   HISTORY OF PRESENT ILLNESS:  Mr. Haven is a 50 year old white male who  relates problems with acid reflux for over 10 years.  He states he had  an endoscopy about 8 years ago that showed acid reflux problems.  He  does not recall who performed the procedure but it was done in  [place].  He has taken different acid medications on and off over the  years but it does not appear he has used them regularly.  He has not  followed antireflux measures regularly.  He was seen in the emergency  room in the summer of 2007 and again in February of 2008 for ongoing  problems with upper abdominal pain, chest pain, reflux, postprandial  fullness and early satiety.  He has gas, bloating and pressure under his  ribs as well.  He denies problems with constipation, diarrhea, weight  loss, melena or hematochezia.  Blood work from the emergency room showed  a minimally elevated amylase at 132 (with a normal range to 131), lipase  was normal at 26, white blood cell count was modestly elevated at 12,  normal hemoglobin at 15.7.  His chemistry panel, liver function tests  and urinalysis were all unremarkable.  The records from the emergency  room Mckenzie a normal abdominal ultrasound in June of 2007.  He underwent a  CT scan of the abdomen on December 01, 2006, which showed some slight  bibasilar lung scarring but no other abnormalities.  He was recommended  to schedule an appointment with Matthew Mckenzie when he was seen in  the emergency room in February but he did not do so.  He followed up  with [facility] and requested to see Matthew Mckenzie, but  now he is  referred to me for further evaluation.  He had some vague  dysphagia symptoms which sound more like early satiety and  regurgitation.  He has no odynophagia or vomiting.   FAMILY HISTORY:  Negative for colon cancer, colon polyps or inflammatory  bowel disease.   PAST MEDICAL HISTORY:  1. Asthma.  2. Kidney stones.  3. Hypertriglyceridemia with levels of 2000 in the past.  4. RLS.  5. Status post hemorrhoid surgery.   CURRENT MEDICATIONS:  1. Prilosec 40 mg q.a.m.  2. ReQuip 1 mg q.h.s.  3. NyQuil p.r.n.  4. Advil p.r.n.   MEDICATION ALLERGIES:  None known.   SOCIAL HISTORY:  He is divorced and lives with his girlfriend and  children.  He is employed as a Curator.  He is a cigarette smoker and  drinks alcohol mainly on the weekends.  He states he generally will  drink a pint of hard liquor over the weekend.   REVIEW OF SYSTEMS:  As per the handwritten form.   PHYSICAL EXAM:  No acute distress.  Height 5 feet 6 inches, weight 191.8  pounds.  Blood pressure is 132/80, pulse 64 and regular.  HEENT EXAM:  Anicteric sclera, oropharynx clear.  CHEST:  Clear to auscultation bilaterally.  CARDIAC:  Regular rate and rhythm without murmurs.  ABDOMEN:  Soft, nontender, nondistended, normal active bowel sounds.  No  palpable organomegaly, masses or hernias.  RECTAL EXAMINATION:  Deferred.  EXTREMITIES:  Without clubbing, cyanosis and edema.  NEUROLOGIC:  Alert and oriented x3.  Grossly nonfocal.   ASSESSMENT AND PLAN:  Gastroesophageal reflux disease with fair to good  symptom control.  He is to begin all standard antireflux measures and  written literature is supplied to the patient and his girlfriend.  He is  advised to cease, or at least to substantially curtail, alcohol usage  and to discontinue smoking by beginning a smoking cessation program  through his primary care Matthew Mckenzie.  He is also advised to begin a weight  loss diet followed by his primary care Matthew Mckenzie.  The risks,  benefits  and alternatives to upper endoscopy with possible biopsy and possible  dilatation discussed with the patient and he consents to proceed.  This  will be scheduled electively.     Matthew Mckenzie. Matthew Dar, MD, Matthew Mckenzie  Electronically Signed    MTS/MedQ  DD: 01/18/2007  DT: 01/18/2007  Job #: [id number]

## 2011-02-28 NOTE — Op Note (Signed)
Matthew Mckenzie, Matthew Mckenzie NO.:  [id number]   MEDICAL RECORD NO.:  [id number]          PATIENT TYPE:  AMB   LOCATION:  DSC                          FACILITY:  [facility]   PHYSICIAN:  Velora Heckler, MD      DATE OF BIRTH:  [birth year]   DATE OF PROCEDURE:  11/07/2004  DATE OF DISCHARGE:                                 OPERATIVE REPORT   PREOPERATIVE DIAGNOSES:  Chronic anal fissure, anal skin tags.   POSTOPERATIVE DIAGNOSES:  Chronic anal fissure, anal skin tags.   PROCEDURE:  1.  Fissurectomy.  2.  Lateral internal sphincterotomy.  3.  Excision of anal skin tags x3.   SURGEON:  Velora Heckler, M.D.   ANESTHESIA:  General.   ESTIMATED BLOOD LOSS:  25 cc.   PREPARATION:  Betadine.   COMPLICATIONS:  None.   INDICATIONS:  The patient is a 50 year old white male referred by Dr.  Lavada Mesi with anorectal pain and bleeding.  This had gone on for  several months.  The patient had undergone colonoscopy and had been told he  had hemorrhoids and fissure.  He was prescribed topical nitroglycerin cream  which he used sporadically due to problems with headaches.  The patient was  evaluated at my practice and diagnosed with chronic anal fissure.  He now  comes to surgery for management.   DESCRIPTION OF PROCEDURE:  The procedure was done in OR #5 at the [facility].  The patient was brought to the operating room and placed in  a supine position on the operating room table.  Following administration of  general anesthesia the patient was placed in lithotomy and prepped and  draped in the usual strict aseptic fashion.  After ascertaining that an  adequate level of anesthesia had been obtained, digital rectal exam was  performed.  There were no masses.  There was normal anal tone.  Anoscopy was  performed and showed a deep, wide, chronic fissure posteriorly with exposed  sphincter muscle at its base.  This was lightly debrided and cauterized with  the  electrocautery.  A less than 1-cm incision was made in the left lateral  position with a #15 blade.  Internal muscle fibers of the sphincter were  isolated on the tip of a hemostat and divided with the electrocautery.  Wound was closed with a figure-of-eight 3-0 chromic gut suture.  Three large  skin tags located posteriorly on both the left and right sides of the anal  orifice were then excised using the electrocautery.  Skin defect was closed  with interrupted 3-0 chromic gut sutures.  Good hemostasis was achieved.  Local block was placed with Marcaine with  epinephrine.  Gelfoam was placed in the anal canal.  Dry gauze dressings and  ABD pad were placed on the perineum.  The patient was awakened from  anesthesia and brought to the recovery room in stable condition.  The  patient tolerated the procedure well.      TMG/MEDQ  D:  11/07/2004  T:  11/07/2004  Job:  [id number]  cc:   Lillia Carmel, M.D.  [address]  [place] [address]  Fax: [phone]

## 2011-05-22 ENCOUNTER — Ambulatory Visit: Payer: Self-pay | Admitting: Internal Medicine

## 2011-05-22 DIAGNOSIS — Z0289 Encounter for other administrative examinations: Secondary | ICD-10-CM

## 2013-06-08 ENCOUNTER — Other Ambulatory Visit: Payer: Self-pay | Admitting: Family Medicine

## 2013-07-12 ENCOUNTER — Other Ambulatory Visit: Payer: Self-pay | Admitting: Family Medicine

## 2013-07-12 ENCOUNTER — Encounter: Payer: Self-pay | Admitting: Family Medicine

## 2013-08-23 ENCOUNTER — Ambulatory Visit (INDEPENDENT_AMBULATORY_CARE_PROVIDER_SITE_OTHER): Payer: Self-pay | Admitting: Family Medicine

## 2013-08-23 ENCOUNTER — Encounter: Payer: Self-pay | Admitting: Family Medicine

## 2013-08-23 VITALS — BP 124/80 | HR 80 | Temp 98.0°F | Resp 18 | Ht 66.0 in | Wt 195.0 lb

## 2013-08-23 DIAGNOSIS — Z23 Encounter for immunization: Secondary | ICD-10-CM

## 2013-08-23 DIAGNOSIS — E119 Type 2 diabetes mellitus without complications: Secondary | ICD-10-CM

## 2013-08-23 DIAGNOSIS — F4320 Adjustment disorder, unspecified: Secondary | ICD-10-CM

## 2013-08-23 DIAGNOSIS — F4321 Adjustment disorder with depressed mood: Secondary | ICD-10-CM

## 2013-08-23 DIAGNOSIS — E785 Hyperlipidemia, unspecified: Secondary | ICD-10-CM | POA: Insufficient documentation

## 2013-08-23 DIAGNOSIS — I10 Essential (primary) hypertension: Secondary | ICD-10-CM

## 2013-08-23 LAB — LIPID PANEL
Cholesterol: 339 mg/dL — ABNORMAL HIGH (ref 0–200)
HDL: 35 mg/dL — ABNORMAL LOW (ref >39)
Total CHOL/HDL Ratio: 9.7 Ratio
Triglycerides: 1867 mg/dL — ABNORMAL HIGH (ref <150)

## 2013-08-23 LAB — CBC WITH DIFFERENTIAL/PLATELET
Basophils Absolute: 0.1 10*3/uL (ref 0.0–0.1)
Eosinophils Absolute: 0.3 10*3/uL (ref 0.0–0.7)
Eosinophils Relative: 4 % (ref 0–5)
HCT: 44.5 % (ref 39.0–52.0)
Hemoglobin: 16 g/dL (ref 13.0–17.0)
Lymphocytes Relative: 34 % (ref 12–46)
MCH: 32.3 pg (ref 26.0–34.0)
MCV: 89.7 fL (ref 78.0–100.0)
Monocytes Absolute: 0.6 10*3/uL (ref 0.1–1.0)
Platelets: 249 10*3/uL (ref 150–400)
RDW: 13.6 % (ref 11.5–15.5)
WBC: 7.5 10*3/uL (ref 4.0–10.5)

## 2013-08-23 LAB — COMPREHENSIVE METABOLIC PANEL
Alkaline Phosphatase: 65 U/L (ref 39–117)
BUN: 10 mg/dL (ref 6–23)
CO2: 25 mEq/L (ref 19–32)
Creat: 0.87 mg/dL (ref 0.50–1.35)
Glucose, Bld: 112 mg/dL — ABNORMAL HIGH (ref 70–99)
Sodium: 138 mEq/L (ref 135–145)
Total Bilirubin: 0.4 mg/dL (ref 0.3–1.2)
Total Protein: 6.9 g/dL (ref 6.0–8.3)

## 2013-08-23 MED ORDER — PRAVASTATIN SODIUM 80 MG PO TABS: ORAL_TABLET | ORAL | Status: DC

## 2013-08-23 MED ORDER — LISINOPRIL 5 MG PO TABS: ORAL_TABLET | ORAL | Status: DC

## 2013-08-23 MED ORDER — METFORMIN HCL 1000 MG PO TABS: ORAL_TABLET | ORAL | Status: DC

## 2013-08-23 MED ORDER — CLONAZEPAM 0.5 MG PO TABS: 0.5000 mg | ORAL_TABLET | Freq: Two times a day (BID) | ORAL | Status: DC | PRN

## 2013-08-23 NOTE — Progress Notes (Signed)
  Subjective:    Patient ID: Matthew Mckenzie, male    DOB: 1961/07/13, 52 y.o.   MRN: [id number]  HPI Patient here to followup chronic medical problems. He is a self-pay patient is only able to come in once a year for his blood work in medications. He does come in today that his father whom he was very close to died about 3 weeks ago. He's been very much on itchiness had crying episodes. He feels like he is unable to calm down and fears be an enlarged crowds at this time. His wife is here who is supporting him and agrees that the past couple weeks he has not been himself. They're requesting something to help calm his nerves during this time.  History diabetes mellitus hyperlipidemia hypertension. He's been taking his medications as prescribed. He is due for fasting labs. He is unable to afford Pneumovax today. He would like to get a flu shot.   Review of Systems  GEN- denies fatigue, fever, weight loss,weakness, recent illness HEENT- denies eye drainage, change in vision, nasal discharge, CVS- denies chest pain, palpitations RESP- denies SOB, cough, wheeze ABD- denies N/V, change in stools, abd pain GU- denies dysuria, hematuria, dribbling, incontinence MSK- denies joint pain, muscle aches, injury Neuro- denies headache, dizziness, syncope, seizure activity      Objective:   Physical Exam GEN- NAD, alert and oriented x3, obese HEENT- PERRL, EOMI, non injected sclera, pink conjunctiva, MMM, oropharynx clear Neck- Supple,  CVS- RRR, no murmur RESP-CTAB EXT- No edema Pulses- Radial, DP- 2+ PSYCH- normal affect and mood       Assessment & Plan:

## 2013-08-23 NOTE — Assessment & Plan Note (Signed)
Check fasting labs Continue metformin Limited labs and immunizations as uninsured

## 2013-08-23 NOTE — Assessment & Plan Note (Signed)
Well controlled, continue ACEI

## 2013-08-23 NOTE — Assessment & Plan Note (Signed)
Low dose short term klonopin

## 2013-08-23 NOTE — Patient Instructions (Signed)
Continue current medications We will send a letter if lab results are normal Flu shot given Release of records- [facility] F/U 6 months if possible

## 2013-08-23 NOTE — Assessment & Plan Note (Signed)
Check FLP, on pravastatin.  

## 2013-08-26 MED ORDER — FENOFIBRATE 145 MG PO TABS: 145.0000 mg | ORAL_TABLET | Freq: Every day | ORAL | Status: DC

## 2013-08-26 NOTE — Addendum Note (Signed)
Addended by: Milinda Antis F on: 08/26/2013 10:53 AM   Modules accepted: Orders

## 2013-08-29 ENCOUNTER — Other Ambulatory Visit: Payer: Self-pay | Admitting: Family Medicine

## 2013-08-29 DIAGNOSIS — E781 Pure hyperglyceridemia: Secondary | ICD-10-CM

## 2013-12-06 ENCOUNTER — Encounter: Payer: Self-pay | Admitting: *Deleted

## 2013-12-06 ENCOUNTER — Encounter: Payer: Self-pay | Admitting: Family Medicine

## 2014-04-01 ENCOUNTER — Other Ambulatory Visit: Payer: Self-pay | Admitting: Family Medicine

## 2014-04-03 NOTE — Telephone Encounter (Signed)
Prescription sent to pharmacy.

## 2014-08-26 ENCOUNTER — Emergency Department (HOSPITAL_COMMUNITY)
Admission: EM | Admit: 2014-08-26 | Discharge: 2014-08-27 | Disposition: A | Discharge status: Home / Self Care | Payer: Self-pay | Attending: Emergency Medicine | Admitting: Emergency Medicine

## 2014-08-26 ENCOUNTER — Encounter (HOSPITAL_COMMUNITY): Payer: Self-pay

## 2014-08-26 ENCOUNTER — Emergency Department (HOSPITAL_COMMUNITY): Payer: Self-pay

## 2014-08-26 DIAGNOSIS — R110 Nausea: Secondary | ICD-10-CM | POA: Insufficient documentation

## 2014-08-26 DIAGNOSIS — Z720 Tobacco use: Secondary | ICD-10-CM | POA: Insufficient documentation

## 2014-08-26 DIAGNOSIS — Z7982 Long term (current) use of aspirin: Secondary | ICD-10-CM | POA: Insufficient documentation

## 2014-08-26 DIAGNOSIS — R10A1 Flank pain, right side: Secondary | ICD-10-CM

## 2014-08-26 DIAGNOSIS — Z79899 Other long term (current) drug therapy: Secondary | ICD-10-CM | POA: Insufficient documentation

## 2014-08-26 DIAGNOSIS — E119 Type 2 diabetes mellitus without complications: Secondary | ICD-10-CM | POA: Insufficient documentation

## 2014-08-26 DIAGNOSIS — Z87442 Personal history of urinary calculi: Secondary | ICD-10-CM | POA: Insufficient documentation

## 2014-08-26 DIAGNOSIS — N200 Calculus of kidney: Secondary | ICD-10-CM

## 2014-08-26 DIAGNOSIS — R109 Unspecified abdominal pain: Secondary | ICD-10-CM | POA: Insufficient documentation

## 2014-08-26 HISTORY — DX: Type 2 diabetes mellitus without complications: E11.9

## 2014-08-26 LAB — CBC WITH DIFFERENTIAL/PLATELET
BASOS PCT: 1 % (ref 0–1)
Basophils Absolute: 0.1 10*3/uL (ref 0.0–0.1)
EOS ABS: 0.3 10*3/uL (ref 0.0–0.7)
Eosinophils Relative: 2 % (ref 0–5)
HCT: 44.7 % (ref 39.0–52.0)
HEMOGLOBIN: 15.2 g/dL (ref 13.0–17.0)
Lymphocytes Relative: 27 % (ref 12–46)
Lymphs Abs: 3.5 10*3/uL (ref 0.7–4.0)
MCH: 32.3 pg (ref 26.0–34.0)
MCHC: 34 g/dL (ref 30.0–36.0)
MCV: 95.1 fL (ref 78.0–100.0)
Monocytes Absolute: 1.2 10*3/uL — ABNORMAL HIGH (ref 0.1–1.0)
Monocytes Relative: 9 % (ref 3–12)
NEUTROS ABS: 7.8 10*3/uL — AB (ref 1.7–7.7)
Neutrophils Relative %: 61 % (ref 43–77)
Platelets: 246 10*3/uL (ref 150–400)
RBC: 4.7 MIL/uL (ref 4.22–5.81)
RDW: 12.7 % (ref 11.5–15.5)
WBC: 12.9 10*3/uL — ABNORMAL HIGH (ref 4.0–10.5)

## 2014-08-26 LAB — COMPREHENSIVE METABOLIC PANEL
ALT: 21 U/L (ref 0–53)
ANION GAP: 15 (ref 5–15)
AST: 18 U/L (ref 0–37)
Albumin: 3.9 g/dL (ref 3.5–5.2)
Alkaline Phosphatase: 84 U/L (ref 39–117)
BUN: 13 mg/dL (ref 6–23)
CALCIUM: 9.9 mg/dL (ref 8.4–10.5)
CO2: 26 mEq/L (ref 19–32)
Chloride: 100 mEq/L (ref 96–112)
Creatinine, Ser: 1.14 mg/dL (ref 0.50–1.35)
GFR calc non Af Amer: 72 mL/min — ABNORMAL LOW (ref >90)
GFR, EST AFRICAN AMERICAN: 83 mL/min — AB (ref >90)
GLUCOSE: 227 mg/dL — AB (ref 70–99)
Potassium: 4.1 mEq/L (ref 3.7–5.3)
SODIUM: 141 meq/L (ref 137–147)
TOTAL PROTEIN: 6.9 g/dL (ref 6.0–8.3)
Total Bilirubin: <0.2 mg/dL — ABNORMAL LOW (ref 0.3–1.2)

## 2014-08-26 LAB — LIPASE, BLOOD: Lipase: 47 U/L (ref 11–59)

## 2014-08-26 LAB — URINALYSIS, ROUTINE W REFLEX MICROSCOPIC
Bilirubin Urine: NEGATIVE
Glucose, UA: 100 mg/dL — AB
Ketones, ur: NEGATIVE mg/dL
Nitrite: NEGATIVE
PROTEIN: NEGATIVE mg/dL
Specific Gravity, Urine: 1.016 (ref 1.005–1.030)
UROBILINOGEN UA: 0.2 mg/dL (ref 0.0–1.0)
pH: 6 (ref 5.0–8.0)

## 2014-08-26 LAB — URINE MICROSCOPIC-ADD ON

## 2014-08-26 MED ORDER — ONDANSETRON HCL 4 MG/2ML IJ SOLN
4.0000 mg | Freq: Once | INTRAMUSCULAR | Status: AC
  Administered 2014-08-26: 4 mg via INTRAVENOUS
  Filled 2014-08-26: qty 2

## 2014-08-26 MED ORDER — HYDROCODONE-ACETAMINOPHEN 5-325 MG PO TABS
1.0000 | ORAL_TABLET | Freq: Once | ORAL | Status: AC
Start: 2014-08-26 — End: 2014-08-26
  Administered 2014-08-26: 1 via ORAL
  Filled 2014-08-26: qty 1

## 2014-08-26 MED ORDER — SODIUM CHLORIDE 0.9 % IV BOLUS (SEPSIS)
1000.0000 mL | Freq: Once | INTRAVENOUS | Status: AC
  Administered 2014-08-26: 1000 mL via INTRAVENOUS

## 2014-08-26 MED ORDER — HYDROMORPHONE HCL 1 MG/ML IJ SOLN
1.0000 mg | Freq: Once | INTRAMUSCULAR | Status: AC
  Administered 2014-08-26: 1 mg via INTRAVENOUS
  Filled 2014-08-26: qty 1

## 2014-08-26 MED ORDER — FENTANYL CITRATE 0.05 MG/ML IJ SOLN: 50.0000 ug | Freq: Once | INTRAMUSCULAR | Status: DC

## 2014-08-26 NOTE — ED Notes (Signed)
Pt. Reports right sided flank pain that radiates around to right groin with nausea. Hx of kidney stones and states this feels the same. Reports blood in urine x1 week.

## 2014-08-26 NOTE — ED Provider Notes (Signed)
CSN: [id number]     Arrival date & time 08/26/14  2010 History   First MD Initiated Contact with Patient 08/26/14 2034     Chief Complaint  Patient presents with  . Nephrolithiasis    Patient is a 53 y.o. male presenting with abdominal pain. The history is provided by the patient.  Abdominal Pain Pain location:  R flank Pain quality: sharp and shooting   Pain radiates to:  Groin Pain severity:  Severe Onset quality:  Sudden Duration:  1 week Timing:  Constant Progression:  Worsening Chronicity:  New Context: not trauma   Relieved by:  None tried Worsened by:  Nothing tried Ineffective treatments:  None tried Associated symptoms: nausea   Associated symptoms: no chills, no cough, no diarrhea, no fever and no vomiting   Nausea:    Severity:  Severe   Onset quality:  Sudden   Duration:  1 day   Timing:  Intermittent   Progression:  Waxing and waning Risk factors comment:  Hx of nephrolithiasis   Past Medical History  Diagnosis Date  . Diabetes mellitus without complication    History reviewed. No pertinent past surgical history.   No family history on file.   History  Substance Use Topics  . Smoking status: Current Every Day Smoker -- 1.00 packs/day    Types: Cigarettes  . Smokeless tobacco: Not on file  . Alcohol Use: 1.2 oz/week    2 Cans of beer per week     Comment: occasionally    Review of Systems  Constitutional: Negative for fever and chills.  Respiratory: Negative for cough.   Gastrointestinal: Positive for nausea and abdominal pain. Negative for vomiting and diarrhea.  All other systems reviewed and are negative.     Allergies  Review of patient's allergies indicates no known allergies.  Home Medications   Prior to Admission medications   Medication Sig Start Date End Date Taking? Authorizing Provider  aspirin 81 MG tablet Take 81 mg by mouth daily.    Historical Provider, MD  cholecalciferol (VITAMIN D) 1000 UNITS tablet Take 1,000 Units by  mouth daily.    Historical Provider, MD  clonazePAM (KLONOPIN) 0.5 MG tablet Take 1 tablet (0.5 mg total) by mouth 2 (two) times daily as needed for anxiety. 08/23/13   Salley ScarletKawanta F [place], MD  fenofibrate (TRICOR) 145 MG tablet Take 1 tablet (145 mg total) by mouth daily. For triglycerides 08/26/13   Salley ScarletKawanta F [place], MD  lisinopril (PRINIVIL,ZESTRIL) 5 MG tablet TAKE ONE TABLET BY MOUTH ONCE DAILY    Salley ScarletKawanta F [place], MD  metFORMIN (GLUCOPHAGE) 1000 MG tablet TAKE ONE TABLET BY MOUTH TWICE DAILY 08/23/13   Salley ScarletKawanta F [place], MD  pravastatin (PRAVACHOL) 80 MG tablet TAKE ONE TABLET BY MOUTH ONCE DAILY AT BEDTIME    Salley ScarletKawanta F [place], MD   BP 160/89 mmHg  Pulse 70  Temp(Src) 98 F (36.7 C)  Resp 18  SpO2 99%   Physical Exam  Constitutional: He is oriented to person, place, and time. He appears well-developed and well-nourished. He appears distressed (writhing in pain).  HENT:  Head: Normocephalic and atraumatic.  Right Ear: External ear normal.  Left Ear: External ear normal.  Mouth/Throat: Oropharynx is clear and moist.  Neck: Normal range of motion.  Cardiovascular: Normal rate and regular rhythm.   Pulses:      Dorsalis pedis pulses are 2+ on the right side, and 2+ on the left side.  Pulmonary/Chest: Effort normal and breath sounds normal. No  respiratory distress. He has no wheezes. He has no rales.  Abdominal: Soft. He exhibits no distension and no pulsatile midline mass. There is no tenderness. There is no rebound and no guarding.  Neurological: He is alert and oriented to person, place, and time.  Skin: Skin is warm and dry. No rash noted. He is not diaphoretic.  Psychiatric: He has a normal mood and affect.  Vitals reviewed.   ED Course  Procedures  Labs Review Labs Reviewed  CBC WITH DIFFERENTIAL - Abnormal; Notable for the following:    WBC 12.9 (*)    Neutro Abs 7.8 (*)    Monocytes Absolute 1.2 (*)    All other components within normal limits  COMPREHENSIVE METABOLIC  PANEL - Abnormal; Notable for the following:    Glucose, Bld 227 (*)    Total Bilirubin <0.2 (*)    GFR calc non Af Amer 72 (*)    GFR calc Af Amer 83 (*)    All other components within normal limits  URINALYSIS, ROUTINE W REFLEX MICROSCOPIC - Abnormal; Notable for the following:    Glucose, UA 100 (*)    Hgb urine dipstick LARGE (*)    Leukocytes, UA SMALL (*)    All other components within normal limits  URINE MICROSCOPIC-ADD ON - Abnormal; Notable for the following:    Crystals CA OXALATE CRYSTALS (*)    All other components within normal limits  URINE CULTURE  LIPASE, BLOOD    Imaging Review [place]  08/26/2014   CLINICAL DATA:  Initial evaluation for right flank pain, visible hematuria for 1 week, personal history of renal calculi  EXAM: RENAL/URINARY TRACT ULTRASOUND COMPLETE  COMPARISON:  12/09/2010  FINDINGS: Right Kidney:  Length: 12.6 cm. Mild prominence of the upper pole collecting system. No stones identified. Normal echogenicity.  Left Kidney:  Length: 12.8 cm. Echogenicity within normal limits. No mass or hydronephrosis visualized.  Bladder:  Appears normal for degree of bladder distention.  IMPRESSION: Negative except for mild upper pole collecting system dilatation on the right. This is new from 2012 but of uncertain chronicity and origin.   Electronically Signed   By: Esperanza Heiraymond  Rubner M.D.   On: 08/26/2014 23:25     MDM   Final diagnoses:  Right flank pain  Nephrolithiasis    53 y.o. male with a history of DM, nephrolithiasis presents to the ED with right flank pain that began 1 week ago, was intermittent. The patient felt that it was a kidney stone and felt that he could pass it. Today, however the pain is worse and constant, radiating to his groin, associated with nausea/vomiting.   Remainder of HPI, ROS, and Exam as above.   Concern for renal colic. UA showed blood, no sign of infection however urine culture pending. Renal US showed mild dilatation of the upper  pole collecting system on the right -- no hydronephrosis.   No concern for SBO, ischemic bowel, aortic aneurysm. His presentation is consistent with renal colic. His labs and imaging support this. His pain has progressed to radiating to his testicle - suspect the stone is moving.   His pain was controlled in the ED with IV and PO pain medication. He was given fluids and antiemetics.   On re-evaluation the patient is in no distress, much more comfortable. I discussed very strict return precautions with the patient and his family. These were given in writing as well. He is to follow up with his PCP in 2 days. Given an  Rx for pain control. Will strain his urine.   This case managed in conjunction with my attending, Dr. Anitra Lauth.    Maxine Glenn, MD 08/27/14 [id number]  Gwyneth Sprout, MD 08/27/14 [phone]

## 2014-08-26 NOTE — ED Notes (Signed)
Patient asked for urine sample. Unable to give sample at this time.

## 2014-08-27 MED ORDER — HYDROCODONE-ACETAMINOPHEN 5-325 MG PO TABS: 1.0000 | ORAL_TABLET | ORAL | Status: DC | PRN

## 2014-08-27 NOTE — Discharge Instructions (Signed)

## 2014-08-27 NOTE — ED Notes (Signed)
Pt. Refused wheelchair 

## 2014-08-29 LAB — URINE CULTURE
Colony Count: NO GROWTH
Culture: NO GROWTH

## 2016-09-08 IMAGING — US US RENAL
1 series · 14 of 25 positions shown · non-contrast
Comparison: 12/09/2010

CLINICAL DATA: Initial evaluation for right flank pain, visible
hematuria for 1 week, personal history of renal calculi

EXAM:
RENAL/URINARY TRACT ULTRASOUND COMPLETE

[Series 1: us renal · 0.27mm/px · 14 of 29 slices shown]
[im 1/29]
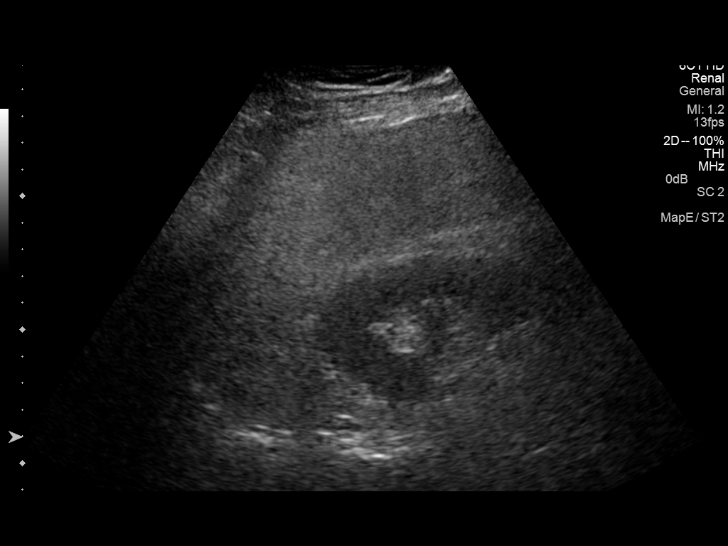
[im 3/29]
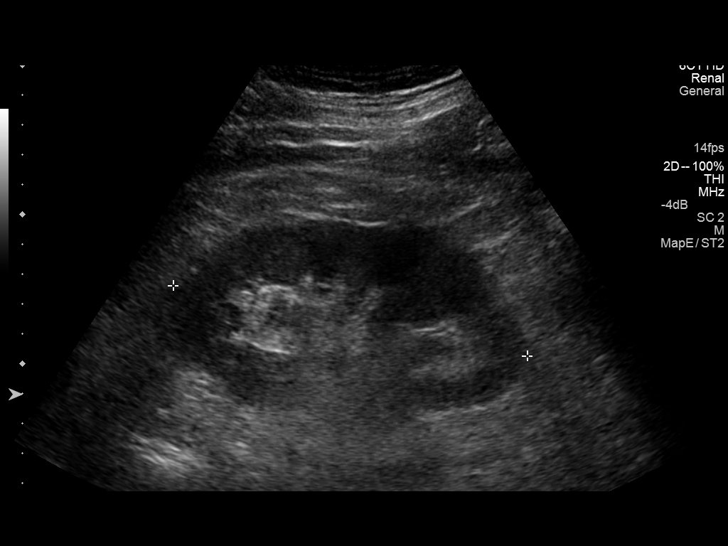
[im 5/29]
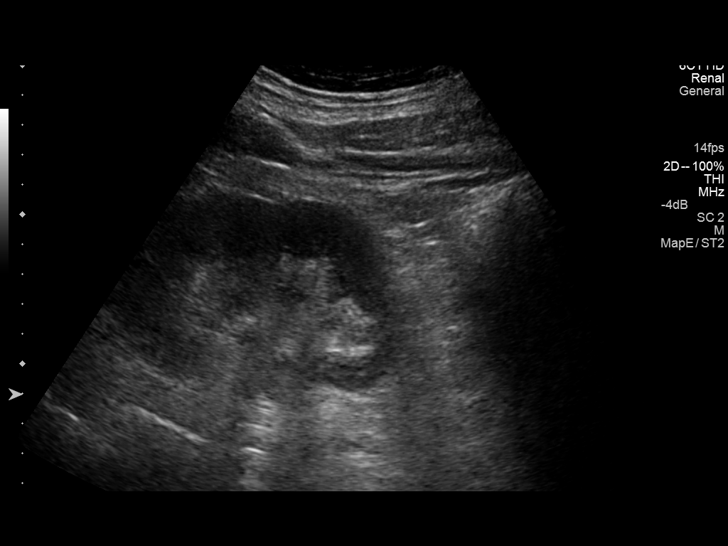
[im 8/29]
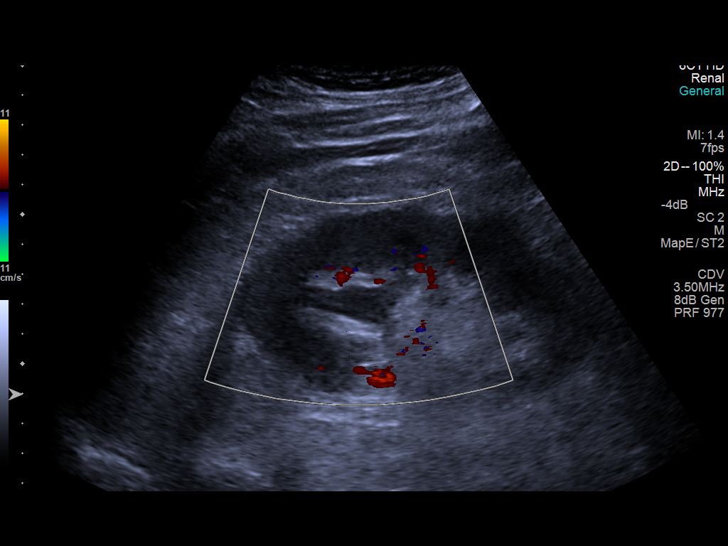
[im 10/29]
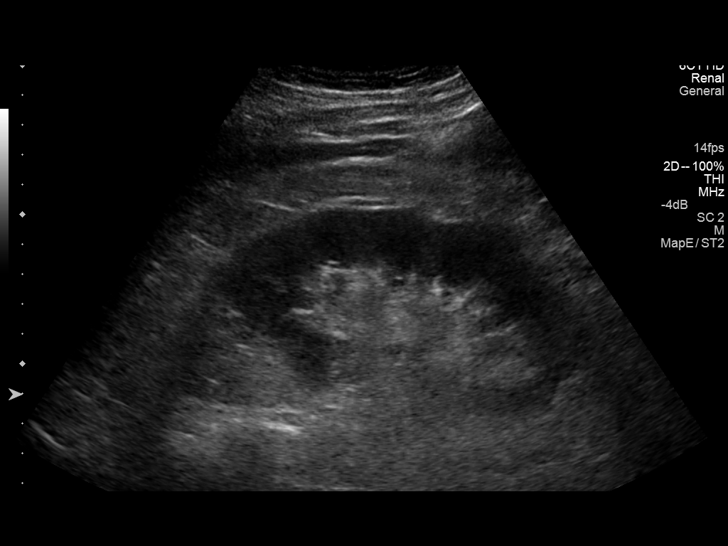
[im 11/29]
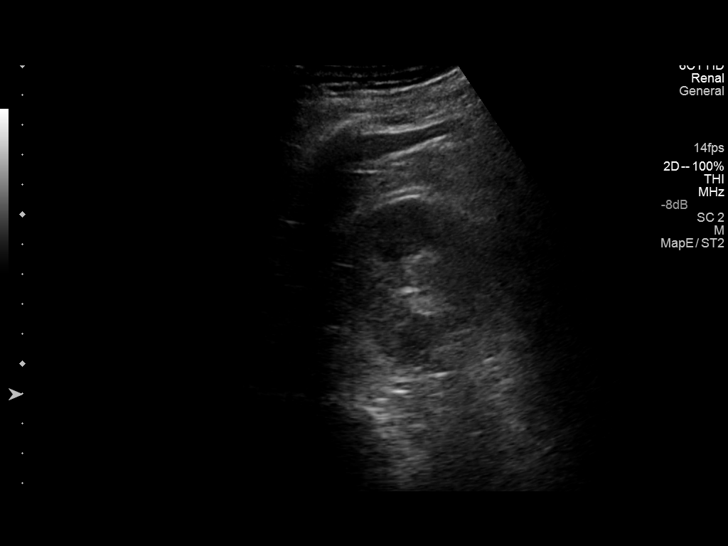
[im 13/29]
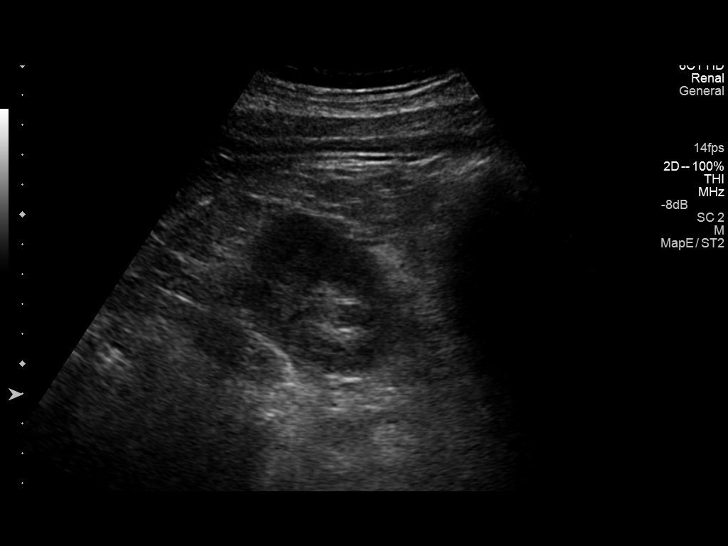
[im 16/29]
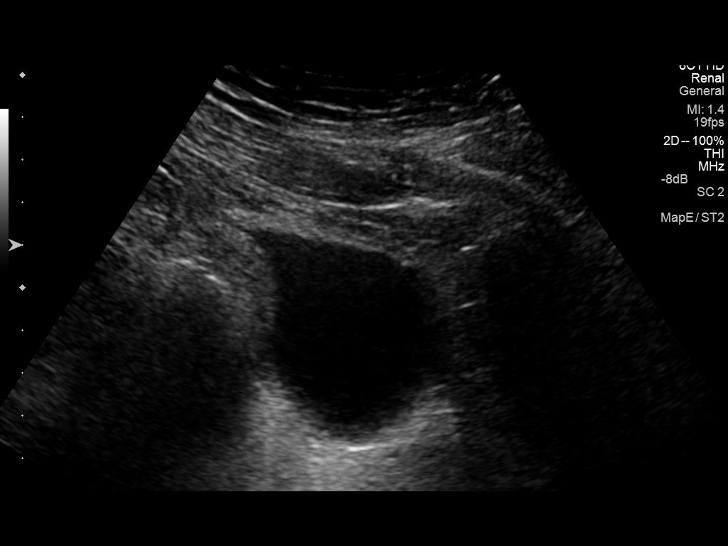
[im 18/29]
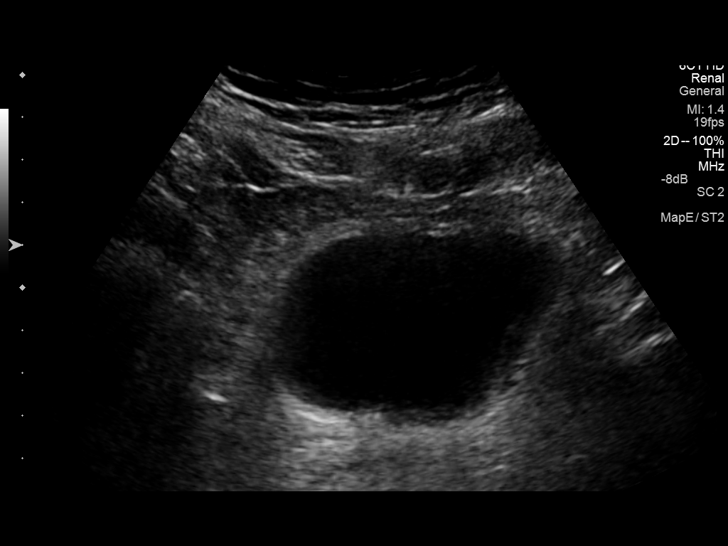
[im 19/29]
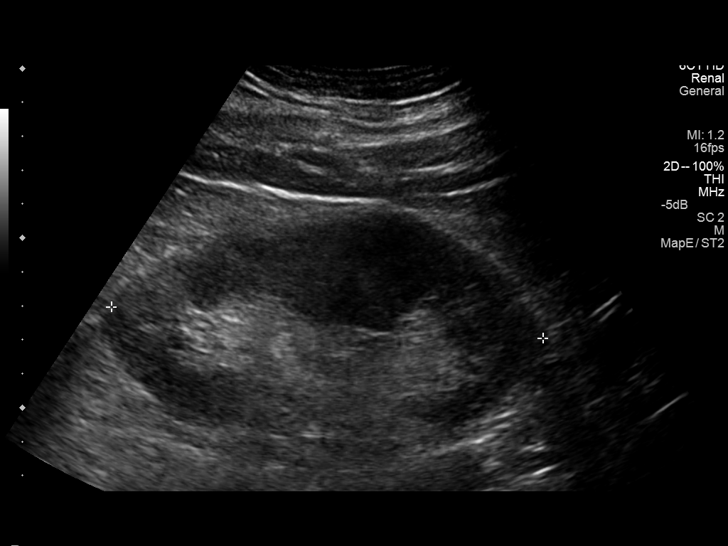
[im 22/29]
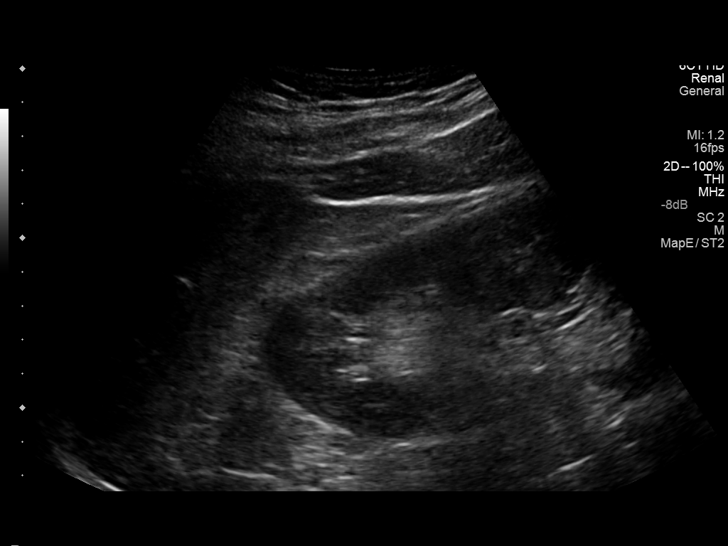
[im 24/29]
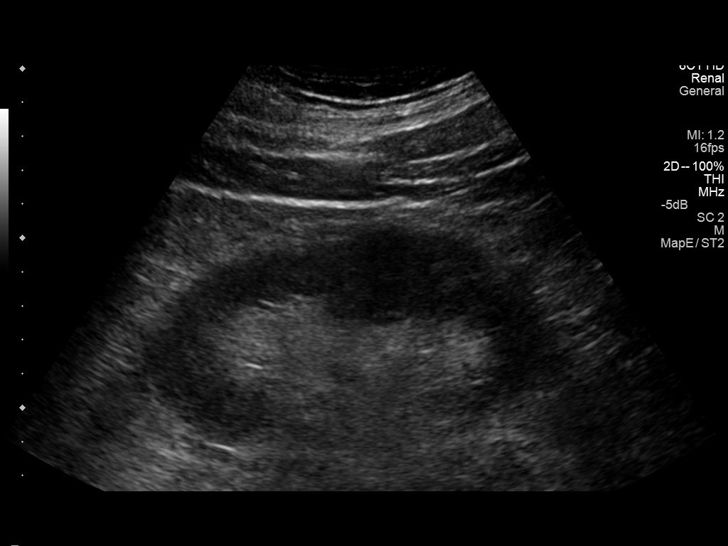
[im 26/29]
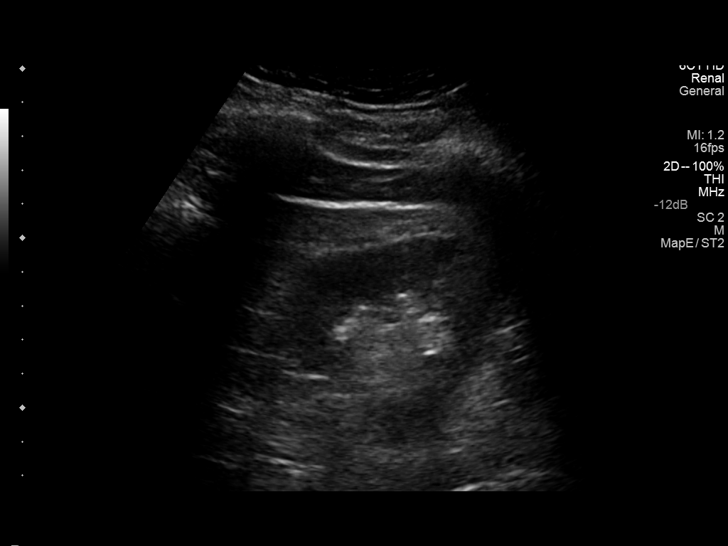
[im 29/29]
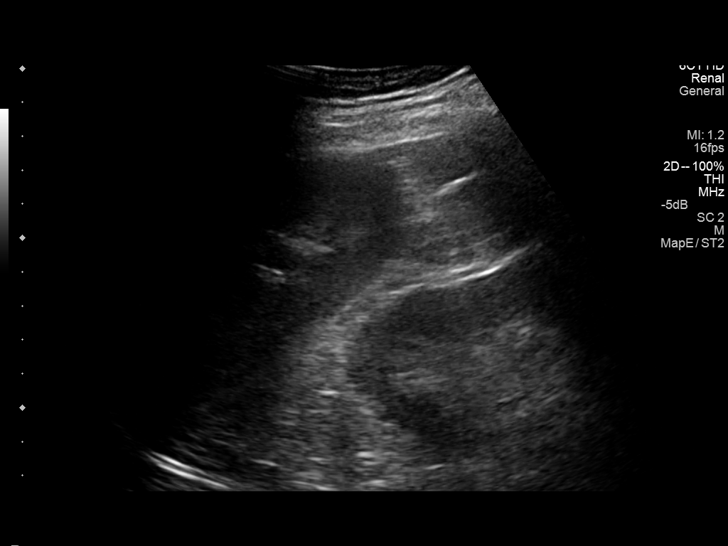

[14 of 25 positions shown; findings below may reference images not displayed]

FINDINGS: Right Kidney:

Length: 12.6 cm. Mild prominence of the upper pole collecting
system. No stones identified. Normal echogenicity.

Left Kidney:

Length: 12.8 cm. Echogenicity within normal limits. No mass or
hydronephrosis visualized.

Bladder:

Appears normal for degree of bladder distention.
IMPRESSION: Negative except for mild upper pole collecting system dilatation on
the right. This is new from 1451 but of uncertain chronicity and
origin.

## 2016-11-12 ENCOUNTER — Ambulatory Visit (INDEPENDENT_AMBULATORY_CARE_PROVIDER_SITE_OTHER): Payer: Self-pay | Admitting: Family Medicine

## 2016-11-12 ENCOUNTER — Encounter: Payer: Self-pay | Admitting: Family Medicine

## 2016-11-12 VITALS — BP 132/86 | HR 81 | Temp 97.9°F | Resp 16 | Ht 66.0 in | Wt 173.0 lb

## 2016-11-12 DIAGNOSIS — E781 Pure hyperglyceridemia: Secondary | ICD-10-CM

## 2016-11-12 DIAGNOSIS — I10 Essential (primary) hypertension: Secondary | ICD-10-CM

## 2016-11-12 DIAGNOSIS — E1165 Type 2 diabetes mellitus with hyperglycemia: Secondary | ICD-10-CM

## 2016-11-12 DIAGNOSIS — K859 Acute pancreatitis without necrosis or infection, unspecified: Secondary | ICD-10-CM

## 2016-11-12 DIAGNOSIS — E755 Other lipid storage disorders: Secondary | ICD-10-CM

## 2016-11-12 DIAGNOSIS — Z125 Encounter for screening for malignant neoplasm of prostate: Secondary | ICD-10-CM

## 2016-11-12 DIAGNOSIS — Z23 Encounter for immunization: Secondary | ICD-10-CM

## 2016-11-12 LAB — POCT GLYCOSYLATED HEMOGLOBIN (HGB A1C): Hemoglobin A1C: 10.5

## 2016-11-12 LAB — GLUCOSE, POCT (MANUAL RESULT ENTRY): POC GLUCOSE: 176 mg/dL — AB (ref 70–99)

## 2016-11-12 MED ORDER — METFORMIN HCL 1000 MG PO TABS: 1000.0000 mg | ORAL_TABLET | Freq: Two times a day (BID) | ORAL | 1 refills | Status: DC

## 2016-11-12 MED ORDER — PRAVASTATIN SODIUM 80 MG PO TABS: 80.0000 mg | ORAL_TABLET | Freq: Every day | ORAL | 1 refills | Status: DC

## 2016-11-12 MED ORDER — LISINOPRIL 5 MG PO TABS: 5.0000 mg | ORAL_TABLET | Freq: Every day | ORAL | 1 refills | Status: AC

## 2016-11-12 NOTE — Progress Notes (Signed)
Chief Complaint  Patient presents with  . Diabetes    check up/ hands and feet get numb/ spots on feet and hands  . Medication Refill    all meds    HPI   Diabetes Mellitus: Patient presents for follow up of diabetes. Symptoms: paresthesia of the feet. Symptoms have gradually worsened. Patient denies foot ulcerations, increase appetite, nausea, polydipsia and polyuria.  Evaluation to date has been included: fasting blood sugar and hemoglobin A1C.  Home sugars: patient does not check sugars. Treatment to date: Restarted metformin which has been effective.  Lab Results  Component Value Date   HGBA1C 10.5 11/12/2016   He reports that his blood glucose was 500mg   He reports that his PCP has been out and so he was not getting his diabetes medications He is taking metformin as monotherapy and was just restarted on metformin Matthew week now He reports that before he ran out of metformin his sugars were well controlled.   Lab Results  Component Value Date   HGBA1C 10.5 11/12/2016    Pancreatitis and Hyperlipidemia Pt reports Matthew history of pancreatitis requiring hospitalization The cause was determined to be hypertriglyceridemia as high as 3000 He reports that at that time he had severe back pain He denies back pain today He reports that he does not know what his triglycerides and lipids are   Hypertension Hypertension: Patient here for follow-up of elevated blood pressure. He is not exercising and is not adherent to low salt diet.  Blood pressure is well controlled at home. Cardiac symptoms none. Patient denies chest pain, chest pressure/discomfort, fatigue, orthopnea and palpitations.  Cardiovascular risk factors: advanced age (older than 7 for men, 63 for women), dyslipidemia, hypertension and male gender. Use of agents associated with hypertension: none. History of target organ damage: none. BP Readings from Last 3 Encounters:  11/12/16 132/86  08/27/14 123/78  08/23/13 124/80    Xanthoma Pt who is here with his sister states that he has lesions on his body that are yellow fine bumps that dont really change or go away. He has them on his knees, his hands and his torso.  He believes it is cholesterol and it runs in the family. He states that it makes his hands and feet very numb.  Past Medical History:  Diagnosis Date  . Cataract   . Diabetes mellitus without complication (HCC)   . Hypertension     Current Outpatient Prescriptions  Medication Sig Dispense Refill  . aspirin 81 MG tablet Take 81 mg by mouth daily.    . Aspirin-Acetaminophen-Caffeine (GOODY HEADACHE PO) Take 1 packet by mouth daily as needed (mild pain / headache).    . cholecalciferol (VITAMIN D) 1000 UNITS tablet Take 1,000 Units by mouth daily.    Marland Kitchen lisinopril (PRINIVIL,ZESTRIL) 5 MG tablet Take 1 tablet (5 mg total) by mouth daily. 90 tablet 1  . metFORMIN (GLUCOPHAGE) 1000 MG tablet Take 1 tablet (1,000 mg total) by mouth 2 (two) times daily with Matthew meal. 180 tablet 1  . pravastatin (PRAVACHOL) 80 MG tablet Take 1 tablet (80 mg total) by mouth at bedtime. 90 tablet 1   No current facility-administered medications for this visit.     Allergies:  Allergies  Allergen Reactions  . P[place]ar Bud Extract Swelling    Past Surgical History:  Procedure Laterality Date  . EYE SURGERY      Social History   Social History  . Marital status: Married    Spouse name: N/Matthew  .  Number of children: N/Matthew  . Years of education: N/Matthew   Social History Main Topics  . Smoking status: Current Every Day Smoker    Packs/day: 1.00    Types: Cigarettes  . Smokeless tobacco: Never Used  . Alcohol use 1.2 oz/week    2 Cans of beer per week     Comment: occasionally  . Drug use: No  . Sexual activity: Not Asked   Other Topics Concern  . None   Social History Narrative  . None    Review of Systems  Constitutional: Negative for chills and fever.  HENT: Negative for congestion, hearing loss, sinus  pain and sore throat.   Eyes: Negative for blurred vision and double vision.  Respiratory: Negative for cough, sputum production, shortness of breath and wheezing.   Cardiovascular: Negative for chest pain, palpitations and leg swelling.  Gastrointestinal: Negative for abdominal pain, diarrhea, nausea and vomiting.  Musculoskeletal: Positive for joint pain. Negative for falls and myalgias.  Skin: Negative for itching and rash.  Neurological: Negative for dizziness, tingling, tremors and headaches.  Psychiatric/Behavioral: Negative for depression. The patient is not nervous/anxious and does not have insomnia.     Objective: Vitals:   11/12/16 1058  BP: 132/86  Pulse: 81  Resp: 16  Temp: 97.9 F (36.6 C)  TempSrc: Oral  SpO2: 95%  Weight: 173 lb (78.5 kg)  Height: 5\' 6"  (1.676 m)   Diabetic Foot Exam - Simple   Simple Foot Form Diabetic Foot exam was performed with the following findings:  Yes 11/12/2016 11:09 AM  Visual Inspection No deformities, no ulcerations, no other skin breakdown bilaterally:  Yes Sensation Testing Intact to touch and monofilament testing bilaterally:  Yes Pulse Check Posterior Tibialis and Dorsalis pulse intact bilaterally:  Yes Comments      Physical Exam  Constitutional: He is oriented to person, place, and time. He appears well-developed and well-nourished.  HENT:  Head: Normocephalic and atraumatic.  Right Ear: External ear normal.  Left Ear: External ear normal.  Nose: Nose normal.  Mouth/Throat: Oropharynx is clear and moist. No oropharyngeal exudate.  Eyes: Conjunctivae and EOM are normal.  Cardiovascular: Normal rate, regular rhythm and normal heart sounds.   No murmur heard. Pulmonary/Chest: Effort normal and breath sounds normal. No respiratory distress. He has no wheezes.  Abdominal: Soft. Bowel sounds are normal. He exhibits no distension. There is hepatomegaly. There is no tenderness. There is no guarding.  Percussing the abdomen  revealed Matthew liver edge 2-3 inches below the ribs  Musculoskeletal: Normal range of motion. He exhibits no edema.  Neurological: He is alert and oriented to person, place, and time. No cranial nerve deficit.  Skin: Skin is warm.  Yellow papsules on knees and hands, yellow-red papules on torso diffusely    Assessment and Plan Matthew Mckenzie was seen today for diabetes and medication refill.  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 and lipids  Hypertriglyceridemia- will monitor since pt has not had levels check for some time -     Comprehensive metabolic  panel -     Lipid panel  Essential hypertension, benign- bp in good range, will add ACE inhibitor since pt is also diabetic -     Comprehensive metabolic panel -     Microalbumin, urine  Flu vaccine need -     Flu Vaccine QUAD 36+ mos IM  Screening for prostate cancer -     PSA  Other orders -     Pneumococcal polysaccharide vaccine 23-valent greater than or equal to 2yo subcutaneous/IM -     metFORMIN (GLUCOPHAGE) 1000 MG tablet; Take 1 tablet (1,000 mg total) by mouth 2 (two) times daily with Matthew meal. -     lisinopril (PRINIVIL,ZESTRIL) 5 MG tablet; Take 1 tablet (5 mg total) by mouth daily. -     pravastatin (PRAVACHOL) 80 MG tablet; Take 1 tablet (80 mg total) by mouth at bedtime.     Matthew Mckenzie Matthew Mckenzie

## 2016-11-12 NOTE — Patient Instructions (Addendum)
   IF you received an x-ray today, you will receive an invoice from [facility]. Please contact [facility] at [phone] with questions or concerns regarding your invoice.   IF you received labwork today, you will receive an invoice from [facility]. Please contact [facility] at [phone] with questions or concerns regarding your invoice.   Our billing staff will not be able to assist you with questions regarding bills from these companies.  You will be contacted with the lab results as soon as they are available. The fastest way to get your results is to activate your My Chart account. Instructions are located on the last page of this paperwork. If you have not heard from us regarding the results in 2 weeks, please contact this office.    Food Choices to Lower Your Triglycerides Triglycerides are a type of fat in your blood. High levels of triglycerides can increase the risk of heart disease and stroke. If your triglyceride levels are high, the foods you eat and your eating habits are very important. Choosing the right foods can help lower your triglycerides. What general guidelines do I need to follow?  Lose weight if you are overweight.  Limit or avoid alcohol.  Fill one half of your plate with vegetables and green salads.  Limit fruit to two servings a day. Choose fruit instead of juice.  Make one fourth of your plate whole grains. Look for the word "whole" as the first word in the ingredient list.  Fill one fourth of your plate with lean protein foods.  Enjoy fatty fish (such as salmon, mackerel, sardines, and tuna) three times a week.  Choose healthy fats.  Limit foods high in starch and sugar.  Eat more home-cooked food and less restaurant, buffet, and fast food.  Limit fried foods.  Cook foods using methods other than frying.  Limit saturated fats.  Check ingredient lists to avoid foods with partially hydrogenated oils (trans fats) in them. What  foods can I eat? Grains Whole grains, such as whole wheat or whole grain breads, crackers, cereals, and pasta. Unsweetened oatmeal, bulgur, barley, quinoa, or brown rice. Corn or whole wheat flour tortillas. Vegetables Fresh or frozen vegetables (raw, steamed, roasted, or grilled). Green salads. Fruits All fresh, canned (in natural juice), or frozen fruits. Meat and Other Protein Products Ground beef (85% or leaner), grass-fed beef, or beef trimmed of fat. Skinless chicken or turkey. Ground chicken or turkey. Pork trimmed of fat. All fish and seafood. Eggs. Dried beans, peas, or lentils. Unsalted nuts or seeds. Unsalted canned or dry beans. Dairy Low-fat dairy products, such as skim or 1% milk, 2% or reduced-fat cheeses, low-fat ricotta or cottage cheese, or plain low-fat yogurt. Fats and Oils Tub margarines without trans fats. Light or reduced-fat mayonnaise and salad dressings. Avocado. Safflower, olive, or canola oils. Natural peanut or almond butter. The items listed above may not be a complete list of recommended foods or beverages. Contact your dietitian for more options. What foods are not recommended? Grains White bread. White pasta. White rice. Cornbread. Bagels, pastries, and croissants. Crackers that contain trans fat. Vegetables White potatoes. Corn. Creamed or fried vegetables. Vegetables in a cheese sauce. Fruits Dried fruits. Canned fruit in light or heavy syrup. Fruit juice. Meat and Other Protein Products Fatty cuts of meat. Ribs, chicken wings, bacon, sausage, bologna, salami, chitterlings, fatback, hot dogs, bratwurst, and packaged luncheon meats. Dairy Whole or 2% milk, cream, half-and-half, and cream cheese. Whole-fat or sweetened yogurt. Full-fat cheeses. Nondairy   creamers and whipped toppings. Processed cheese, cheese spreads, or cheese curds. Sweets and Desserts Corn syrup, sugars, honey, and molasses. Candy. Jam and jelly. Syrup. Sweetened cereals. Cookies, pies,  cakes, donuts, muffins, and ice cream. Fats and Oils Butter, stick margarine, lard, shortening, ghee, or bacon fat. Coconut, palm kernel, or palm oils. Beverages Alcohol. Sweetened drinks (such as sodas, lemonade, and fruit drinks or punches). The items listed above may not be a complete list of foods and beverages to avoid. Contact your dietitian for more information. This information is not intended to replace advice given to you by your health care provider. Make sure you discuss any questions you have with your health care provider. Document Released: 07/17/2004 Document Revised: 03/06/2016 Document Reviewed: 08/03/2013 Elsevier Interactive Patient Education  2017 [employer] Inc.  

## 2016-11-13 LAB — COMPREHENSIVE METABOLIC PANEL
A/G RATIO: 1.6 (ref 1.2–2.2)
ALT: 19 IU/L (ref 0–44)
AST: 22 IU/L (ref 0–40)
Albumin: 4.4 g/dL (ref 3.5–5.5)
Alkaline Phosphatase: 131 IU/L — ABNORMAL HIGH (ref 39–117)
BUN/Creatinine Ratio: 14 (ref 9–20)
BUN: 11 mg/dL (ref 6–24)
Bilirubin Total: 0.3 mg/dL (ref 0.0–1.2)
CO2: 20 mmol/L (ref 18–29)
CREATININE: 0.78 mg/dL (ref 0.76–1.27)
Calcium: 9.8 mg/dL (ref 8.7–10.2)
Chloride: 102 mmol/L (ref 96–106)
GFR, EST AFRICAN AMERICAN: 117 mL/min/{1.73_m2} (ref >59)
GFR, EST NON AFRICAN AMERICAN: 102 mL/min/{1.73_m2} (ref >59)
Globulin, Total: 2.7 g/dL (ref 1.5–4.5)
Glucose: 265 mg/dL — ABNORMAL HIGH (ref 65–99)
POTASSIUM: 5.1 mmol/L (ref 3.5–5.2)
Sodium: 143 mmol/L (ref 134–144)
TOTAL PROTEIN: 7.1 g/dL (ref 6.0–8.5)

## 2016-11-13 LAB — LIPID PANEL
Chol/HDL Ratio: 70.4 ratio units — ABNORMAL HIGH (ref 0.0–5.0)
Cholesterol, Total: 704 mg/dL (ref 100–199)
HDL: 10 mg/dL — ABNORMAL LOW (ref >39)
TRIGLYCERIDES: 4058 mg/dL — AB (ref 0–149)

## 2016-11-13 LAB — MICROALBUMIN, URINE: MICROALBUM., U, RANDOM: 16.4 ug/mL

## 2016-11-13 LAB — PSA: Prostate Specific Ag, Serum: 0.3 ng/mL (ref 0.0–4.0)

## 2016-11-13 LAB — LIPASE: Lipase: 51 U/L (ref 13–78)

## 2016-11-14 ENCOUNTER — Telehealth: Payer: Self-pay | Admitting: Family Medicine

## 2016-11-14 NOTE — Telephone Encounter (Signed)
I have tried to reach the patient.  Please have him come in next week to talk about strategies to treat his high triglycerides so he does not end up back in pancreatitis.

## 2016-11-14 NOTE — Telephone Encounter (Signed)
PATIENT'S SISTER (TONNETTE HUNT) IS RETURNING DR. Creta LevinSTALLINGS CALL FROM EARLIER TODAY. THE WRONG CELL PHONE NUMBER WAS IN THE COMPUTER. HIS SISTER IS ON HIS 2018 HIPAA AND SHE IS THE ONE WHO BOUGHT HIM IN. BEST PHONE [phone] (SISTER IS TONNETTE HUNT) MBC

## 2016-11-14 NOTE — Telephone Encounter (Signed)
Sister advised and tx up front for appt

## 2016-11-19 NOTE — Progress Notes (Signed)
Is the patient set up for a follow up appointment?

## 2016-11-21 ENCOUNTER — Telehealth: Payer: Self-pay

## 2016-11-21 ENCOUNTER — Ambulatory Visit (INDEPENDENT_AMBULATORY_CARE_PROVIDER_SITE_OTHER): Payer: Self-pay | Admitting: Family Medicine

## 2016-11-21 VITALS — BP 132/82 | HR 116 | Temp 98.1°F | Resp 17 | Ht 67.5 in | Wt 172.0 lb

## 2016-11-21 DIAGNOSIS — E1165 Type 2 diabetes mellitus with hyperglycemia: Secondary | ICD-10-CM

## 2016-11-21 DIAGNOSIS — Z720 Tobacco use: Secondary | ICD-10-CM

## 2016-11-21 DIAGNOSIS — E781 Pure hyperglyceridemia: Secondary | ICD-10-CM

## 2016-11-21 DIAGNOSIS — Z716 Tobacco abuse counseling: Secondary | ICD-10-CM

## 2016-11-21 DIAGNOSIS — E755 Other lipid storage disorders: Secondary | ICD-10-CM

## 2016-11-21 MED ORDER — GEMFIBROZIL 600 MG PO TABS: 600.0000 mg | ORAL_TABLET | Freq: Two times a day (BID) | ORAL | 12 refills | Status: DC

## 2016-11-21 NOTE — Telephone Encounter (Signed)
Patients wife called in stating that Dr Creta LevinStallings only called on one RX from todays visit and she thought there were suppose to be more, she didn't know the names but she said they talked about a lot of different meds and the prices of them and she would like to have someone call her back to discuss if there are more medications that need to be ordered.  Her call back number is [phone]

## 2016-11-21 NOTE — Telephone Encounter (Signed)
Pt's wife paid for 11/21/16 visit. Wanted to know why the charge was higher than the last visit. I explained that it depends on the level of service. She asked that I put in a note.

## 2016-11-21 NOTE — Patient Instructions (Addendum)
  Nicotine Patches Start with 21mg /day patch for 6 weeks followed by 14mg /day dose for 2 weeks and finish with 7mg /day patch for 2 weeks.  ?Apply one patch each morning to any non-hairy skin site; rotate the site daily to avoid skin irritation, the most common side effect. Over-the-counter hydrocortisone 1% cream or ointment topically may be used to relieve skin irritation if it occurs. ?Remove and replace the patch with a new one at bedtime. However, if leaving the patch on overnight is causing the frequently reported side effects of insomnia and vivid dreams, replace the patch the next morning. Smoking cessation rates are similar whether the patch is left on for 24 hours or taken off at night.   Return every 3 months for fasting labs for cholesterol.  IF you received an x-ray today, you will receive an invoice from [facility]. Please contact [facility] at [phone] with questions or concerns regarding your invoice.   IF you received labwork today, you will receive an invoice from [facility]. Please contact [facility] at [phone] with questions or concerns regarding your invoice.   Our billing staff will not be able to assist you with questions regarding bills from these companies.  You will be contacted with the lab results as soon as they are available. The fastest way to get your results is to activate your My Chart account. Instructions are located on the last page of this paperwork. If you have not heard from us regarding the results in 2 weeks, please contact this office.

## 2016-11-21 NOTE — Progress Notes (Signed)
Chief Complaint  Patient presents with  . Follow-up    cholesterol     HPI   Diabetes Mellitus Patient restarted his metformin He reports some dyspepsia He still has not been able to check his sugars  His dry mouth and increased urination has increased  Dyslipidemia He has restarted his statin He denies myalgia but reports that his xanthoma He eats a healthy diet  Tobacco Use She reports that she has been trying to cut down  He is still smoking in response to stress Discussed that he should limit smoking after meals and in the car He declined medications or nicotine replacement   Past Medical History:  Diagnosis Date  . Cataract   . Diabetes mellitus without complication (HCC)   . Hypertension     Current Outpatient Prescriptions  Medication Sig Dispense Refill  . aspirin 81 MG tablet Take 81 mg by mouth daily.    . Aspirin-Acetaminophen-Caffeine (GOODY HEADACHE PO) Take 1 packet by mouth daily as needed (mild pain / headache).    . cholecalciferol (VITAMIN D) 1000 UNITS tablet Take 1,000 Units by mouth daily.    Marland Kitchen lisinopril (PRINIVIL,ZESTRIL) 5 MG tablet Take 1 tablet (5 mg total) by mouth daily. 90 tablet 1  . metFORMIN (GLUCOPHAGE) 1000 MG tablet Take 1 tablet (1,000 mg total) by mouth 2 (two) times daily with a meal. 180 tablet 1  . pravastatin (PRAVACHOL) 80 MG tablet Take 1 tablet (80 mg total) by mouth at bedtime. 90 tablet 1  . gemfibrozil (LOPID) 600 MG tablet Take 1 tablet (600 mg total) by mouth 2 (two) times daily before a meal. 60 tablet 12   No current facility-administered medications for this visit.     Allergies:  Allergies  Allergen Reactions  . Poplar Bud Extract Swelling    Past Surgical History:  Procedure Laterality Date  . EYE SURGERY      Social History   Social History  . Marital status: Married    Spouse name: N/A  . Number of children: N/A  . Years of education: N/A   Social History Main Topics  . Smoking status: Current  Every Day Smoker    Packs/day: 1.00    Types: Cigarettes  . Smokeless tobacco: Never Used  . Alcohol use 1.2 oz/week    2 Cans of beer per week     Comment: occasionally  . Drug use: No  . Sexual activity: Not Asked   Other Topics Concern  . None   Social History Narrative  . None    ROS  Objective: Vitals:   11/21/16 1447  BP: 132/82  Pulse: (!) 116  Resp: 17  Temp: 98.1 F (36.7 C)  TempSrc: Oral  SpO2: 97%  Weight: 172 lb (78 kg)  Height: 5' 7.5" (1.715 m)    Physical Exam  Constitutional: He is oriented to person, place, and time. He appears well-developed and well-nourished.  HENT:  Head: Normocephalic and atraumatic.  Cardiovascular: Normal rate, regular rhythm and normal heart sounds.   Pulmonary/Chest: Effort normal and breath sounds normal. No respiratory distress. He has no wheezes. He has no rales.  Neurological: He is alert and oriented to person, place, and time.    Assessment and Plan Elston was seen today for follow-up.  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  Uncontrolled type 2 diabetes mellitus with hyperglycemia, without long-term current use of insulin (HCC)-  Continue aspirin, pt has restarted metformin and pravachol      Matthew Mckenzie

## 2016-11-22 NOTE — Telephone Encounter (Signed)
Left message there was only one medication prescribed during visit Pt was instructed to take new medication Gemfibrozil and continue taking Pravastatin for cholesterol control Verbal order given per Dr.Stallings regarding drug interaction of the two- and okay 'd to take both.

## 2016-12-31 ENCOUNTER — Telehealth: Payer: Self-pay | Admitting: Family Medicine

## 2016-12-31 NOTE — Telephone Encounter (Signed)
LMOM TO CALL AND RESCHEDULE HIS APPOINTMENT THAT HE HAD WITH STALLINGS  ON 01-14-17

## 2017-01-14 ENCOUNTER — Ambulatory Visit: Payer: Self-pay | Admitting: Family Medicine

## 2017-03-21 ENCOUNTER — Other Ambulatory Visit: Payer: Self-pay | Admitting: Family Medicine

## 2017-03-23 NOTE — Telephone Encounter (Signed)
Call --- I approved only a one month supply of Metformin for patient is overdue for three month follow-up with Dr. Creta LevinStallings.  Pt was to return in May 2018 for reevaluation.  Please schedule OV with Dr. Creta LevinStallings in the upcoming month.

## 2017-11-13 ENCOUNTER — Telehealth: Payer: Self-pay | Admitting: Family Medicine

## 2017-11-13 NOTE — Telephone Encounter (Signed)
Copied from CRM [phone]#[id number]. Topic: Quick Communication - See Telephone Encounter >> Nov 13, 2017  9:09 AM Jolayne Hainesaylor, Brittany L wrote: CRM for notification. See Telephone encounter for:   11/13/17.  Patient's sister called Arts development officer(Tonnette Hunt) wants to know when he last had his flu shot/pneumonia shot. She states that [facility] & [facility] would [facility]. Call back is [phone] work  Cell number [phone]

## 2017-11-17 NOTE — Telephone Encounter (Signed)
Lm to call back.  11-12-2016 for both vaccines

## 2017-11-17 NOTE — Telephone Encounter (Signed)
No release.  Patients last flu/pnemococcal was 1.31.2018

## 2018-11-10 DIAGNOSIS — Z0271 Encounter for disability determination: Secondary | ICD-10-CM

## 2021-05-29 NOTE — H&P (Signed)
HISTORY AND PHYSICAL  Matthew Mckenzie is a 60 y.o. male patient referred by his general dentist for full mouth extractions in preparation for upper and lower dentures.  CC: Bad teeth.  No diagnosis found.  Past Medical History:  Diagnosis Date   Cataract    Diabetes mellitus without complication (HCC)    Hypertension     No current facility-administered medications for this encounter.   Current Outpatient Medications  Medication Sig Dispense Refill   aspirin 81 MG tablet Take 81 mg by mouth daily.     Aspirin-Acetaminophen-Caffeine (GOODY HEADACHE PO) Take 1 packet by mouth daily as needed (mild pain / headache).     cholecalciferol (VITAMIN D) 1000 UNITS tablet Take 1,000 Units by mouth daily.     gemfibrozil (LOPID) 600 MG tablet Take 1 tablet (600 mg total) by mouth 2 (two) times daily before a meal. 60 tablet 12   lisinopril (PRINIVIL,ZESTRIL) 5 MG tablet Take 1 tablet (5 mg total) by mouth daily. 90 tablet 1   metFORMIN (GLUCOPHAGE) 1000 MG tablet TAKE ONE TABLET BY MOUTH TWICE DAILY WITH  A  MEAL 60 tablet 0   pravastatin (PRAVACHOL) 80 MG tablet Take 1 tablet (80 mg total) by mouth at bedtime. 90 tablet 1   Allergies  Allergen Reactions   Poplar Bud Extract Swelling   Active Problems:   * No active hospital problems. *  Vitals: There were no vitals taken for this visit. Lab results:No results found for this or any previous visit (from the past 24 hour(s)). Radiology Results: No results found. General appearance: alert, cooperative, no distress, and morbidly obese Head: Normocephalic, without obvious abnormality, atraumatic Eyes: negative Nose: Nares normal. Septum midline. Mucosa normal. No drainage or sinus tenderness. Throat: Rampant dental caries upper and lower arch. Bilateral mandibular linguial tori. No purulence, edema, fluctuance, Pharynx clear.  Neck: no adenopathy Resp: clear to auscultation bilaterally Cardio: regular rate and rhythm, S1, S2 normal, no  murmur, click, rub or gallop  Assessment: All teeth non-restorable secondary to dental caries. Bilateral lingual tori.    Plan: Full mouth extraction with alveoloplasty. Removal lingual tori. [place], Day Surgery.   Ocie Doyne 05/29/2021

## 2021-05-30 ENCOUNTER — Encounter (HOSPITAL_COMMUNITY): Payer: Self-pay | Admitting: Oral Surgery

## 2021-05-30 ENCOUNTER — Other Ambulatory Visit: Payer: Self-pay

## 2021-05-30 NOTE — Progress Notes (Addendum)
Mr. Beier has not answered the phone, patient was called yesterday and today; the voice mail is not set up. I called and left a voice message at Dr. Randa Evens office, asking for another number and if they have an H/P and labs more recently than 2018.  Sam from Dr. Randa Evens office returned my call.  The office has medical clearance from [place], Martina Sinner, patient saw Dr .Jeani Sow. 12/21/20.   Antionette Poles, PA-C is reviewing chart  Mr. Pieczynski' sister Ms.Hunt called to give [place] the patient's number.  The number we had was her number.   Mr. Letts denies chest pain or shortness of breath. Patient denies having any s/s of Covid in his household.  Patient denies any known exposure to Covid.   Mr. Leiner has type II diabetes.  Patient reports that CBGs run in the 150 range. I instructed Mr. Corvin to take 11 units of Levemir Insulin tonight and if CBG is greater than 70 in am to take 11 units of Levemir; do not take Metformin in am. I instructed patient to check CBG after awaking and every 2 hours until arrival  to the hospital.  I Instructed patient if CBG is less than 70 to take 4 Glucose Tablets or 1 tube of Glucose Gel or 1/2 cup of a clear juice. Recheck CBG in 15 minutes if CBG is not over 70 call, pre- op desk at [phone] for further instructions. If scheduled to receive Insulin, do not take Insulin.  The Pharmacy has not been able to get in touch with Mr.Cech, patient is bring his medications with him.  I confirmed the medications that I instructed patient to take.  I instructed Mr. Kreeger to not take vitamins, Fish oil or Goody powder tonight or in the am.  I instructed patient to shower with antibiotic soap, if it is available.  Dry off with a clean towel. Do not put lotion, powder, cologne or deodorant or makeup.No jewelry or piercings. Men may shave their face and neck. Woman should not shave. No nail polish, artificial or acrylic nails. Wear clean clothes, brush your teeth. Glasses, contact  lens,dentures or partials may not be worn in the OR. If you need to wear them, please bring a case for glasses, do not wear contacts or bring a case, the hospital does not have contact cases, dentures or partials will have to be removed , make sure they are clean, we will provide a denture cup to put them in. You will need some one to drive you home and a responsible person over the age of 4 to stay with you for the first 24 hours after surgery.

## 2021-05-31 ENCOUNTER — Ambulatory Visit (HOSPITAL_COMMUNITY)
Admission: RE | Admit: 2021-05-31 | Discharge: 2021-05-31 | Disposition: A | Discharge status: Home / Self Care | Payer: Medicaid Other | Attending: Oral Surgery | Admitting: Oral Surgery

## 2021-05-31 ENCOUNTER — Encounter (HOSPITAL_COMMUNITY): Payer: Self-pay | Admitting: Oral Surgery

## 2021-05-31 ENCOUNTER — Encounter (HOSPITAL_COMMUNITY)
Admission: RE | Disposition: A | Discharge status: Home / Self Care | Payer: Self-pay | Source: Home / Self Care | Attending: Oral Surgery

## 2021-05-31 ENCOUNTER — Ambulatory Visit (HOSPITAL_COMMUNITY): Payer: Medicaid Other | Admitting: Physician Assistant

## 2021-05-31 ENCOUNTER — Other Ambulatory Visit: Payer: Self-pay

## 2021-05-31 DIAGNOSIS — E119 Type 2 diabetes mellitus without complications: Secondary | ICD-10-CM | POA: Insufficient documentation

## 2021-05-31 DIAGNOSIS — Z7984 Long term (current) use of oral hypoglycemic drugs: Secondary | ICD-10-CM | POA: Insufficient documentation

## 2021-05-31 DIAGNOSIS — K029 Dental caries, unspecified: Secondary | ICD-10-CM | POA: Diagnosis not present

## 2021-05-31 DIAGNOSIS — M270 Developmental disorders of jaws: Secondary | ICD-10-CM | POA: Insufficient documentation

## 2021-05-31 DIAGNOSIS — Z6837 Body mass index (BMI) 37.0-37.9, adult: Secondary | ICD-10-CM | POA: Insufficient documentation

## 2021-05-31 DIAGNOSIS — Z7982 Long term (current) use of aspirin: Secondary | ICD-10-CM | POA: Diagnosis not present

## 2021-05-31 DIAGNOSIS — Z9109 Other allergy status, other than to drugs and biological substances: Secondary | ICD-10-CM | POA: Insufficient documentation

## 2021-05-31 DIAGNOSIS — E669 Obesity, unspecified: Secondary | ICD-10-CM | POA: Insufficient documentation

## 2021-05-31 DIAGNOSIS — Z79899 Other long term (current) drug therapy: Secondary | ICD-10-CM | POA: Diagnosis not present

## 2021-05-31 HISTORY — DX: Pneumonia, unspecified organism: J18.9

## 2021-05-31 HISTORY — DX: Sleep apnea, unspecified: G47.30

## 2021-05-31 HISTORY — DX: Gastro-esophageal reflux disease without esophagitis: K21.9

## 2021-05-31 HISTORY — PX: ALVEOPLASTY: SHX5345

## 2021-05-31 HISTORY — DX: Personal history of urinary calculi: Z87.442

## 2021-05-31 HISTORY — DX: Mixed hyperlipidemia: E78.2

## 2021-05-31 HISTORY — DX: Polyneuropathy, unspecified: G62.9

## 2021-05-31 HISTORY — PX: TOOTH EXTRACTION: SHX859

## 2021-05-31 LAB — CBC
HCT: 43.9 % (ref 39.0–52.0)
Hemoglobin: 15 g/dL (ref 13.0–17.0)
MCH: 33 pg (ref 26.0–34.0)
MCHC: 34.2 g/dL (ref 30.0–36.0)
MCV: 96.7 fL (ref 80.0–100.0)
Platelets: 188 10*3/uL (ref 150–400)
RBC: 4.54 MIL/uL (ref 4.22–5.81)
RDW: 13.5 % (ref 11.5–15.5)
WBC: 6.5 10*3/uL (ref 4.0–10.5)
nRBC: 0 % (ref 0.0–0.2)

## 2021-05-31 LAB — COMPREHENSIVE METABOLIC PANEL
ALT: 17 U/L (ref 0–44)
AST: 28 U/L (ref 15–41)
Albumin: 4 g/dL (ref 3.5–5.0)
Alkaline Phosphatase: 72 U/L (ref 38–126)
Anion gap: 7 (ref 5–15)
BUN: 11 mg/dL (ref 6–20)
CO2: 26 mmol/L (ref 22–32)
Calcium: 9.1 mg/dL (ref 8.9–10.3)
Chloride: 104 mmol/L (ref 98–111)
Creatinine, Ser: 0.73 mg/dL (ref 0.61–1.24)
GFR, Estimated: >60 mL/min (ref >60)
Glucose, Bld: 107 mg/dL — ABNORMAL HIGH (ref 70–99)
Potassium: 4 mmol/L (ref 3.5–5.1)
Sodium: 137 mmol/L (ref 135–145)
Total Bilirubin: 0.5 mg/dL (ref 0.3–1.2)
Total Protein: 6.8 g/dL (ref 6.5–8.1)

## 2021-05-31 LAB — GLUCOSE, CAPILLARY
Glucose-Capillary: 122 mg/dL — ABNORMAL HIGH (ref 70–99)
Glucose-Capillary: 104 mg/dL — ABNORMAL HIGH (ref 70–99)
Glucose-Capillary: 131 mg/dL — ABNORMAL HIGH (ref 70–99)

## 2021-05-31 SURGERY — DENTAL RESTORATION/EXTRACTIONS
Anesthesia: General | Site: Mouth

## 2021-05-31 MED ORDER — OXYCODONE HCL 5 MG/5ML PO SOLN: 5.0000 mg | Freq: Once | ORAL | Status: DC | PRN

## 2021-05-31 MED ORDER — PHENYLEPHRINE 40 MCG/ML (10ML) SYRINGE FOR IV PUSH (FOR BLOOD PRESSURE SUPPORT)
PREFILLED_SYRINGE | INTRAVENOUS | Status: DC | PRN
  Administered 2021-05-31 (×2): 80 ug via INTRAVENOUS

## 2021-05-31 MED ORDER — PROMETHAZINE HCL 25 MG/ML IJ SOLN: 6.2500 mg | INTRAMUSCULAR | Status: DC | PRN

## 2021-05-31 MED ORDER — OXYMETAZOLINE HCL 0.05 % NA SOLN
NASAL | Status: DC | PRN
  Administered 2021-05-31: 1 via TOPICAL

## 2021-05-31 MED ORDER — SODIUM CHLORIDE 0.9 % IR SOLN
Status: DC | PRN
  Administered 2021-05-31: 1000 mL

## 2021-05-31 MED ORDER — FENTANYL CITRATE (PF) 250 MCG/5ML IJ SOLN
INTRAMUSCULAR | Status: AC
  Filled 2021-05-31: qty 5

## 2021-05-31 MED ORDER — OXYMETAZOLINE HCL 0.05 % NA SOLN
NASAL | Status: AC
  Filled 2021-05-31: qty 30

## 2021-05-31 MED ORDER — CEFAZOLIN SODIUM-DEXTROSE 2-4 GM/100ML-% IV SOLN
2.0000 g | INTRAVENOUS | Status: AC
  Administered 2021-05-31: 2 g via INTRAVENOUS
  Filled 2021-05-31: qty 100

## 2021-05-31 MED ORDER — SUGAMMADEX SODIUM 200 MG/2ML IV SOLN
INTRAVENOUS | Status: DC | PRN
  Administered 2021-05-31: 25 mg via INTRAVENOUS
  Administered 2021-05-31: 200 mg via INTRAVENOUS

## 2021-05-31 MED ORDER — CHLORHEXIDINE GLUCONATE 0.12 % MT SOLN
15.0000 mL | Freq: Once | OROMUCOSAL | Status: AC
  Administered 2021-05-31: 15 mL via OROMUCOSAL
  Filled 2021-05-31: qty 15

## 2021-05-31 MED ORDER — MIDAZOLAM HCL 5 MG/5ML IJ SOLN
INTRAMUSCULAR | Status: DC | PRN
  Administered 2021-05-31: 2 mg via INTRAVENOUS

## 2021-05-31 MED ORDER — ROCURONIUM BROMIDE 10 MG/ML (PF) SYRINGE
PREFILLED_SYRINGE | INTRAVENOUS | Status: AC
  Filled 2021-05-31: qty 10

## 2021-05-31 MED ORDER — AMOXICILLIN 500 MG PO CAPS: 500.0000 mg | ORAL_CAPSULE | Freq: Three times a day (TID) | ORAL | 0 refills | Status: DC

## 2021-05-31 MED ORDER — FENTANYL CITRATE (PF) 100 MCG/2ML IJ SOLN: 25.0000 ug | INTRAMUSCULAR | Status: DC | PRN

## 2021-05-31 MED ORDER — MIDAZOLAM HCL 2 MG/2ML IJ SOLN
INTRAMUSCULAR | Status: AC
  Filled 2021-05-31: qty 2

## 2021-05-31 MED ORDER — PROPOFOL 10 MG/ML IV BOLUS
INTRAVENOUS | Status: AC
  Filled 2021-05-31: qty 20

## 2021-05-31 MED ORDER — OXYCODONE HCL 5 MG PO TABS: 5.0000 mg | ORAL_TABLET | Freq: Once | ORAL | Status: DC | PRN

## 2021-05-31 MED ORDER — FENTANYL CITRATE (PF) 250 MCG/5ML IJ SOLN
INTRAMUSCULAR | Status: DC | PRN
  Administered 2021-05-31: 100 ug via INTRAVENOUS
  Administered 2021-05-31: 50 ug via INTRAVENOUS

## 2021-05-31 MED ORDER — DEXAMETHASONE SODIUM PHOSPHATE 10 MG/ML IJ SOLN
INTRAMUSCULAR | Status: DC | PRN
  Administered 2021-05-31: 4 mg via INTRAVENOUS

## 2021-05-31 MED ORDER — STERILE WATER FOR IRRIGATION IR SOLN
Status: DC | PRN
  Administered 2021-05-31: 1000 mL

## 2021-05-31 MED ORDER — LACTATED RINGERS IV SOLN: INTRAVENOUS | Status: DC

## 2021-05-31 MED ORDER — LIDOCAINE-EPINEPHRINE 2 %-1:100000 IJ SOLN
INTRAMUSCULAR | Status: DC | PRN
  Administered 2021-05-31: 13 mL

## 2021-05-31 MED ORDER — ROCURONIUM BROMIDE 10 MG/ML (PF) SYRINGE
PREFILLED_SYRINGE | INTRAVENOUS | Status: DC | PRN
  Administered 2021-05-31: 50 mg via INTRAVENOUS

## 2021-05-31 MED ORDER — ORAL CARE MOUTH RINSE: 15.0000 mL | Freq: Once | OROMUCOSAL | Status: AC

## 2021-05-31 MED ORDER — PROPOFOL 10 MG/ML IV BOLUS
INTRAVENOUS | Status: DC | PRN
  Administered 2021-05-31: 50 mg via INTRAVENOUS
  Administered 2021-05-31: 150 mg via INTRAVENOUS
  Administered 2021-05-31: 50 mg via INTRAVENOUS

## 2021-05-31 MED ORDER — LIDOCAINE 2% (20 MG/ML) 5 ML SYRINGE
INTRAMUSCULAR | Status: DC | PRN
  Administered 2021-05-31: 60 mg via INTRAVENOUS

## 2021-05-31 MED ORDER — CEFAZOLIN SODIUM 1 G IJ SOLR
INTRAMUSCULAR | Status: AC
  Filled 2021-05-31: qty 10

## 2021-05-31 MED ORDER — OXYCODONE-ACETAMINOPHEN 5-325 MG PO TABS: 1.0000 | ORAL_TABLET | ORAL | 0 refills | Status: DC | PRN

## 2021-05-31 MED ORDER — LIDOCAINE-EPINEPHRINE 2 %-1:100000 IJ SOLN
INTRAMUSCULAR | Status: AC
  Filled 2021-05-31: qty 1

## 2021-05-31 MED ORDER — ONDANSETRON HCL 4 MG/2ML IJ SOLN
INTRAMUSCULAR | Status: AC
  Filled 2021-05-31: qty 2

## 2021-05-31 MED ORDER — DEXAMETHASONE SODIUM PHOSPHATE 10 MG/ML IJ SOLN
INTRAMUSCULAR | Status: AC
  Filled 2021-05-31: qty 1

## 2021-05-31 MED ORDER — ONDANSETRON HCL 4 MG/2ML IJ SOLN
INTRAMUSCULAR | Status: DC | PRN
  Administered 2021-05-31: 4 mg via INTRAVENOUS

## 2021-05-31 MED ORDER — 0.9 % SODIUM CHLORIDE (POUR BTL) OPTIME
TOPICAL | Status: DC | PRN
  Administered 2021-05-31: 1000 mL

## 2021-05-31 SURGICAL SUPPLY — 37 items
BAG COUNTER SPONGE SURGICOUNT (BAG) IMPLANT
BAG SPNG CNTER NS LX DISP (BAG)
BLADE SURG 15 STRL LF DISP TIS (BLADE) ×1 IMPLANT
BLADE SURG 15 STRL SS (BLADE) ×2
BUR CROSS CUT FISSURE 1.6 (BURR) ×2 IMPLANT
BUR EGG ELITE 4.0 (BURR) ×2 IMPLANT
CANISTER SUCT 3000ML PPV (MISCELLANEOUS) ×2 IMPLANT
COVER SURGICAL LIGHT HANDLE (MISCELLANEOUS) ×2 IMPLANT
DECANTER SPIKE VIAL GLASS SM (MISCELLANEOUS) ×2 IMPLANT
DRAPE U-SHAPE 76X120 STRL (DRAPES) ×2 IMPLANT
GAUZE PACKING FOLDED 2  STR (GAUZE/BANDAGES/DRESSINGS) ×2
GAUZE PACKING FOLDED 2 STR (GAUZE/BANDAGES/DRESSINGS) ×1 IMPLANT
GLOVE SURG ENC MOIS LTX SZ6.5 (GLOVE) IMPLANT
GLOVE SURG ENC MOIS LTX SZ7 (GLOVE) IMPLANT
GLOVE SURG ENC MOIS LTX SZ8 (GLOVE) ×2 IMPLANT
GLOVE SURG UNDER POLY LF SZ6.5 (GLOVE) IMPLANT
GLOVE SURG UNDER POLY LF SZ7 (GLOVE) IMPLANT
GOWN STRL REUS W/ TWL LRG LVL3 (GOWN DISPOSABLE) ×1 IMPLANT
GOWN STRL REUS W/ TWL XL LVL3 (GOWN DISPOSABLE) ×1 IMPLANT
GOWN STRL REUS W/TWL LRG LVL3 (GOWN DISPOSABLE) ×2
GOWN STRL REUS W/TWL XL LVL3 (GOWN DISPOSABLE) ×2
IV NS 1000ML (IV SOLUTION) ×2
IV NS 1000ML BAXH (IV SOLUTION) ×1 IMPLANT
KIT BASIN OR (CUSTOM PROCEDURE TRAY) ×2 IMPLANT
KIT TURNOVER KIT B (KITS) ×2 IMPLANT
NDL HYPO 25GX1X1/2 BEV (NEEDLE) ×2 IMPLANT
NEEDLE HYPO 25GX1X1/2 BEV (NEEDLE) ×4 IMPLANT
NS IRRIG 1000ML POUR BTL (IV SOLUTION) ×2 IMPLANT
PAD ARMBOARD 7.5X6 YLW CONV (MISCELLANEOUS) ×2 IMPLANT
SLEEVE IRRIGATION ELITE 7 (MISCELLANEOUS) ×2 IMPLANT
SPONGE SURGIFOAM ABS GEL 12-7 (HEMOSTASIS) IMPLANT
SUT CHROMIC 3 0 PS 2 (SUTURE) ×3 IMPLANT
SYR BULB IRRIG 60ML STRL (SYRINGE) ×2 IMPLANT
SYR CONTROL 10ML LL (SYRINGE) ×2 IMPLANT
TRAY ENT MC OR (CUSTOM PROCEDURE TRAY) ×2 IMPLANT
TUBING IRRIGATION (MISCELLANEOUS) ×2 IMPLANT
YANKAUER SUCT BULB TIP NO VENT (SUCTIONS) ×2 IMPLANT

## 2021-05-31 NOTE — Op Note (Signed)
05/31/2021  2:38 PM  PATIENT:  Jaquelyn Bitter  60 y.o. male  PRE-OPERATIVE DIAGNOSIS:  NON RESTORABLE TEETH NUMBER TWO, THREE, FOUR, FIVE, SIX, SEVEN, EIGHT, NINE, TEN, ELEVEN, TWELVE, THIRTEEN, SEVENTEEN, EIGHTEEN, NINETEEN, TWENTY, TWENTY-ONE, TWENTY-TWO, TWENTY-THREE, TWENTY-FOUR, TWENTY-FIVE, TWENTY-SIX, TWENTY-SEVEN, TWENTY-EIGHT, TWENTY-NINE, THIRTY, THIRTY-TWO; BILATERAL MANDIBULAR LINGUAL TORI  POST-OPERATIVE DIAGNOSIS:  SAME  PROCEDURE:  Procedure(s): DENTAL RESTORATION/EXTRACTIONS OF TEETH NUMBER TWO, THREE, FOUR, FIVE, SIX, SEVEN, EIGHT, NINE, TEN, ELEVEN, TWELVE, THIRTEEN, SEVENTEEN, EIGHTEEN, NINETEEN, TWENTY, TWENTY-ONE, TWENTY-TWO, TWENTY-THREE, TWENTY-FOUR, TWENTY-FIVE, TWENTY-SIX, TWENTY-SEVEN, TWENTY-EIGHT, TWENTY-NINE, THIRTY, THIRTY-TWO AND REMOVAL OF BILATERAL MANDIBULAR LINGUAL TORI ALVEOPLASTY  SURGEON:  Surgeon(s): Ocie Doyne, DMD  ANESTHESIA:   local and general  EBL:  minimal  DRAINS: none   SPECIMEN:  No Specimen  COUNTS:  YES  PLAN OF CARE: Discharge to home after PACU  PATIENT DISPOSITION:  PACU - hemodynamically stable.   PROCEDURE DETAILS: Dictation # [id number]  Georgia Lopes, DMD 05/31/2021 2:38 PM

## 2021-05-31 NOTE — Op Note (Signed)
NAME: Matthew Mckenzie, Matthew Mckenzie MEDICAL RECORD NO: [id number] ACCOUNT NO: [id number] DATE OF BIRTH: 1961-07-06 FACILITY: [facility] LOCATION: [facility] PHYSICIAN: Georgia Lopes, DDS  Operative Report   DATE OF PROCEDURE: 05/31/2021  PREOPERATIVE DIAGNOSES:  Nonrestorable teeth secondary to dental caries numbers 2, 3, 4, 5, 6, 7, 8, 9, 10, 11, 12, 13, 17, 18, 19, 20, 21, 22, 23, 24, 25, 26, 27, 28, 29, 30, 32 and bilateral mandibular lingual tori.  POSTOPERATIVE DIAGNOSES:  Nonrestorable teeth secondary to dental caries numbers 2, 3, 4, 5, 6, 7, 8, 9, 10, 11, 12, 13, 17, 18, 19, 20, 21, 22, 23, 24, 25, 26, 27, 28, 29, 30, 32 and bilateral mandibular lingual tori.  PROCEDURE:  Extraction teeth numbers 2, 3, 4, 5, 6, 7, 8, 9, 10, 11, 12, 13, 17, 18, 19, 20, 21, 22, 23, 24, 25, 26, 27, 28, 29, 30 and 32, alveoplasty right and left maxilla and mandible, removal bilateral mandibular lingual tori.  SURGEON:  Ocie Doyne, DDS  ANESTHESIA:  General, nasal intubation.  Dr. Mal Amabile attending.  DESCRIPTION OF PROCEDURE:  The patient was taken to the operating room and placed on the table in supine position.  General anesthesia was administered and a nasal endotracheal tube was placed and secured.  The eyes were protected and the patient was  draped for surgery.  Timeout was performed.  The posterior pharynx was suctioned and a throat pack was placed.  2% lidocaine, 1:100,000 epinephrine was infiltrated in an inferior alveolar block on the right and left sides and in buccal and palatal  infiltration in the maxilla.  A bite block was placed on the right side of the mouth, a sweetheart retractor was used to retract the tongue.  A #15 blade was used to make an incision beginning at tooth #17 in the left mandible, carried forward across the  midline to tooth #27 in both the buccal and lingual sulcus.  The periosteum was reflected from around these teeth.  The posterior teeth 17, 18, 19 were removed with the rongeurs.  Tooth  numbers 20, 23, 24, 25, 26, 27 were removed by using the 301  elevator and Ash forceps.  Tooth numbers 21 and 22 could not be removed with the forceps and fractured upon attempted removal.  The Stryker handpiece with a fissure bur was used to remove bone proximally and circumferentially around these two teeth and  they were removed using 301 elevator and the rongeur.  Then the sockets were curetted.  The periosteum was reflected to expose the alveolar crest, which was irregular in contour owing to the periodontal disease and also to expose the lingual torus.   Then, the Stryker handpiece with egg bur was used to perform an alveoplasty to smooth the alveolar bone and to remove the lingual torus on the left side.  Then the areas were further smoothed using a bone file, then irrigated and closed with 3-0 chromic.   Then the left maxilla was operated.  A 15 blade was used to make an incision around teeth numbers 13, 12, 11, 10, 9, 8 and 7 in the buccal and palatal sulcus.  The periosteum was reflected.  The teeth were elevated with 301 elevator and removed from  the mouth with a dental forceps.  Sockets were curetted.  The periosteum was reflected and the alveolus was irregular in contour with undercuts and irregular border. Then the alveoplasty was performed using the egg bur followed by the bone file.  The  area was then  irrigated and closed with 3-0 chromic.  The bite block and sweetheart retractor were repositioned to the other side of the mouth and the 15 blade was used to make an incision around teeth numbers 28, 29 and 30.  A small fragment of tooth  #32 was present and it was removed using the rongeurs.  The periosteum was reflected from around the lower right teeth and the teeth were elevated with 301 elevator and removed from the mouth with a dental forceps.  The sockets were then curetted.  The  periosteum was reflected to expose the alveolar crest, which had undercuts and irregular contour as well as  the lingual torus on the right side.  The egg bur was then used to reduce the torus and remove the undercuts and smooth the alveolus.  Bone file  was used to smooth the area and then the area was closed with 3-0 chromic after irrigating. In the right maxilla, a 15 blade was used to make an incision around teeth numbers 2, 3, 4, 5, 6.  The periosteum was reflected.  The teeth were elevated with 301  elevator and removed from the mouth with a dental forceps.  The sockets were curetted.  The periosteum was reflected to expose the alveolar crest.  Alveoplasty was performed using the egg bur followed by the bone file.  Then the area was irrigated and  closed with 3-0 chromic.  Then, the oral cavity was inspected and found to have good contour, hemostasis and closure.  The throat pack was removed after irrigation and suctioning.  Additional anesthetic was given.  Then the patient was left in care of  anesthesia for extubation and transport to recovery room with plans for discharge home through day surgery.  ESTIMATED BLOOD LOSS:  Minimum.  COMPLICATIONS:  None.  SPECIMENS:  None.   SHW D: 05/31/2021 2:44:53 pm T: 05/31/2021 9:59:00 pm  JOB: [id number]/ [id number]

## 2021-05-31 NOTE — H&P (Signed)
H&P documentation  -History and Physical Reviewed  -Patient has been re-examined  -No change in the plan of care  Matthew Mckenzie  

## 2021-05-31 NOTE — Transfer of Care (Signed)
Immediate Anesthesia Transfer of Care Note  Patient: Matthew Mckenzie  Procedure(s) Performed: DENTAL RESTORATION/EXTRACTIONS OF TEETH NUMBER TWO, THREE, FOUR, FIVE, SIX, SEVEN, EIGHT, NINE, TEN, ELEVEN, TWELVE, THIRTEEN, SEVENTEEN, EIGHTEEN, NINETEEN, TWENTY, TWENTY-ONE, TWENTY-TWO, TWENTY-THREE, TWENTY-FOUR, TWENTY-FIVE, TWENTY-SIX, TWENTY-SEVEN, TWENTY-EIGHT, TWENTY-NINE, THIRTY, THIRTY-ONE AND REMOVAL OF BILATERAL MANDIBULAR LINGUAL TORI (Mouth) ALVEOPLASTY (Mouth)  Patient Location: PACU  Anesthesia Type:General  Level of Consciousness: awake, oriented and patient cooperative  Airway & Oxygen Therapy: Patient Spontanous Breathing and Patient connected to face mask oxygen  Post-op Assessment: Report given to RN and Post -op Vital signs reviewed and stable  Post vital signs: Reviewed  Last Vitals:  Vitals Value Taken Time  BP 134/69 05/31/21 1455  Temp    Pulse 59 05/31/21 1457  Resp 14 05/31/21 1457  SpO2 100 % 05/31/21 1457  Vitals shown include unvalidated device data.  Last Pain:  Vitals:   05/31/21 1032  TempSrc:   PainSc: 5       Patients Stated Pain Goal: 3 (05/31/21 1032)  Complications: No notable events documented.

## 2021-05-31 NOTE — Anesthesia Preprocedure Evaluation (Addendum)
Anesthesia Evaluation  Patient identified by MRN, date of birth, ID band Patient awake    Reviewed: Allergy & Precautions, NPO status , Patient's Chart, lab work & pertinent test results  History of Anesthesia Complications Negative for: history of anesthetic complications  Airway Mallampati: II  TM Distance: >3 FB Neck ROM: Full    Dental  (+) Dental Advisory Given, Poor Dentition, Chipped, Missing   Pulmonary sleep apnea , Current SmokerPatient did not abstain from smoking.,    Pulmonary exam normal        Cardiovascular hypertension, Normal cardiovascular exam     Neuro/Psych  Neuromuscular disease negative psych ROS   GI/Hepatic Neg liver ROS, GERD  Medicated and Controlled,  Endo/Other  diabetes, Poorly Controlled, Type 2, Insulin Dependent, Oral Hypoglycemic Agents Obesity   Renal/GU negative Renal ROS     Musculoskeletal negative musculoskeletal ROS (+)   Abdominal   Peds  Hematology negative hematology ROS (+)   Anesthesia Other Findings   Reproductive/Obstetrics                            Anesthesia Physical Anesthesia Plan  ASA: 3  Anesthesia Plan: General   Post-op Pain Management:    Induction: Intravenous  PONV Risk Score and Plan: 1 and Treatment may vary due to age or medical condition, Ondansetron, Midazolam and Scopolamine patch - Pre-op  Airway Management Planned: Nasal ETT  Additional Equipment: None  Intra-op Plan:   Post-operative Plan: Extubation in OR  Informed Consent: I have reviewed the patients History and Physical, chart, labs and discussed the procedure including the risks, benefits and alternatives for the proposed anesthesia with the patient or authorized representative who has indicated his/her understanding and acceptance.     Dental advisory given  Plan Discussed with: CRNA and Anesthesiologist  Anesthesia Plan Comments:         Anesthesia Quick Evaluation

## 2021-05-31 NOTE — Anesthesia Procedure Notes (Signed)
Procedure Name: Intubation Date/Time: 05/31/2021 1:42 PM Performed by: Jonna Munro, CRNA Pre-anesthesia Checklist: Patient identified, Emergency Drugs available, Suction available, Patient being monitored and Timeout performed Patient Re-evaluated:Patient Re-evaluated prior to induction Oxygen Delivery Method: Circle system utilized Preoxygenation: Pre-oxygenation with 100% oxygen Induction Type: IV induction Ventilation: Two handed mask ventilation required Laryngoscope Size: Mac and 4 Grade View: Grade I Nasal Tubes: Nasal Rae and Magill forceps- large, utilized Tube size: 7.0 mm Number of attempts: 1 Placement Confirmation: ETT inserted through vocal cords under direct vision, positive ETCO2 and breath sounds checked- equal and bilateral Secured at: 22 cm Tube secured with: Tape Dental Injury: Teeth and Oropharynx as per pre-operative assessment

## 2021-05-31 NOTE — Anesthesia Postprocedure Evaluation (Signed)
Anesthesia Post Note  Patient: Matthew Mckenzie  Procedure(s) Performed: DENTAL RESTORATION/EXTRACTIONS OF TEETH NUMBER TWO, THREE, FOUR, FIVE, SIX, SEVEN, EIGHT, NINE, TEN, ELEVEN, TWELVE, THIRTEEN, SEVENTEEN, EIGHTEEN, NINETEEN, TWENTY, TWENTY-ONE, TWENTY-TWO, TWENTY-THREE, TWENTY-FOUR, TWENTY-FIVE, TWENTY-SIX, TWENTY-SEVEN, TWENTY-EIGHT, TWENTY-NINE, THIRTY, THIRTY-ONE AND REMOVAL OF BILATERAL MANDIBULAR LINGUAL TORI (Mouth) ALVEOPLASTY (Mouth)     Patient location during evaluation: PACU Anesthesia Type: General Level of consciousness: awake and alert Pain management: pain level controlled Vital Signs Assessment: post-procedure vital signs reviewed and stable Respiratory status: spontaneous breathing, nonlabored ventilation and respiratory function stable Cardiovascular status: blood pressure returned to baseline and stable Postop Assessment: no apparent nausea or vomiting Anesthetic complications: no   No notable events documented.  Last Vitals:  Vitals:   05/31/21 1540 05/31/21 1555  BP: 131/81 131/84  Pulse: (!) 58 (!) 55  Resp: 14 17  Temp:  (!) 36.4 C  SpO2: 93% 92%    Last Pain:  Vitals:   05/31/21 1555  TempSrc:   PainSc: 3                  Beryle Lathe

## 2021-06-01 ENCOUNTER — Encounter (HOSPITAL_COMMUNITY): Payer: Self-pay | Admitting: Oral Surgery

## 2022-10-17 ENCOUNTER — Emergency Department (HOSPITAL_COMMUNITY)
Admission: EM | Admit: 2022-10-17 | Discharge: 2022-10-17 | Disposition: A | Discharge status: Home / Self Care | Payer: Medicaid Other | Attending: Emergency Medicine | Admitting: Emergency Medicine

## 2022-10-17 ENCOUNTER — Emergency Department (HOSPITAL_COMMUNITY): Payer: Medicaid Other

## 2022-10-17 ENCOUNTER — Other Ambulatory Visit: Payer: Self-pay

## 2022-10-17 ENCOUNTER — Encounter (HOSPITAL_COMMUNITY): Payer: Self-pay | Admitting: Emergency Medicine

## 2022-10-17 DIAGNOSIS — N200 Calculus of kidney: Secondary | ICD-10-CM | POA: Insufficient documentation

## 2022-10-17 DIAGNOSIS — Z79899 Other long term (current) drug therapy: Secondary | ICD-10-CM | POA: Insufficient documentation

## 2022-10-17 DIAGNOSIS — E119 Type 2 diabetes mellitus without complications: Secondary | ICD-10-CM | POA: Insufficient documentation

## 2022-10-17 DIAGNOSIS — I10 Essential (primary) hypertension: Secondary | ICD-10-CM | POA: Insufficient documentation

## 2022-10-17 DIAGNOSIS — R1031 Right lower quadrant pain: Secondary | ICD-10-CM | POA: Diagnosis present

## 2022-10-17 DIAGNOSIS — Z7984 Long term (current) use of oral hypoglycemic drugs: Secondary | ICD-10-CM | POA: Diagnosis not present

## 2022-10-17 DIAGNOSIS — Z794 Long term (current) use of insulin: Secondary | ICD-10-CM | POA: Insufficient documentation

## 2022-10-17 LAB — BASIC METABOLIC PANEL
Anion gap: 8 (ref 5–15)
BUN: 11 mg/dL (ref 8–23)
CO2: 25 mmol/L (ref 22–32)
Calcium: 9.7 mg/dL (ref 8.9–10.3)
Chloride: 100 mmol/L (ref 98–111)
Creatinine, Ser: 0.92 mg/dL (ref 0.61–1.24)
GFR, Estimated: >60 mL/min (ref >60)
Glucose, Bld: 158 mg/dL — ABNORMAL HIGH (ref 70–99)
Potassium: 4.2 mmol/L (ref 3.5–5.1)
Sodium: 133 mmol/L — ABNORMAL LOW (ref 135–145)

## 2022-10-17 LAB — CBC WITH DIFFERENTIAL/PLATELET
Abs Immature Granulocytes: 0.06 10*3/uL (ref 0.00–0.07)
Basophils Absolute: 0.1 10*3/uL (ref 0.0–0.1)
Basophils Relative: 1 %
Eosinophils Absolute: 0.4 10*3/uL (ref 0.0–0.5)
Eosinophils Relative: 4 %
HCT: 48.9 % (ref 39.0–52.0)
Hemoglobin: 16.7 g/dL (ref 13.0–17.0)
Immature Granulocytes: 1 %
Lymphocytes Relative: 22 %
Lymphs Abs: 2.2 10*3/uL (ref 0.7–4.0)
MCH: 31.6 pg (ref 26.0–34.0)
MCHC: 34.2 g/dL (ref 30.0–36.0)
MCV: 92.4 fL (ref 80.0–100.0)
Monocytes Absolute: 0.9 10*3/uL (ref 0.1–1.0)
Monocytes Relative: 9 %
Neutro Abs: 6.4 10*3/uL (ref 1.7–7.7)
Neutrophils Relative %: 63 %
Platelets: 241 10*3/uL (ref 150–400)
RBC: 5.29 MIL/uL (ref 4.22–5.81)
RDW: 13.3 % (ref 11.5–15.5)
WBC: 10 10*3/uL (ref 4.0–10.5)
nRBC: 0 % (ref 0.0–0.2)

## 2022-10-17 LAB — URINALYSIS, ROUTINE W REFLEX MICROSCOPIC
Bacteria, UA: NONE SEEN
Bilirubin Urine: NEGATIVE
Glucose, UA: NEGATIVE mg/dL
Ketones, ur: NEGATIVE mg/dL
Leukocytes,Ua: NEGATIVE
Nitrite: NEGATIVE
Protein, ur: NEGATIVE mg/dL
Specific Gravity, Urine: 1.016 (ref 1.005–1.030)
pH: 6 (ref 5.0–8.0)

## 2022-10-17 MED ORDER — ONDANSETRON HCL 4 MG PO TABS: 4.0000 mg | ORAL_TABLET | Freq: Four times a day (QID) | ORAL | 0 refills | Status: DC

## 2022-10-17 MED ORDER — ONDANSETRON 4 MG PO TBDP
4.0000 mg | ORAL_TABLET | Freq: Once | ORAL | Status: AC
  Administered 2022-10-17: 4 mg via ORAL
  Filled 2022-10-17: qty 1

## 2022-10-17 MED ORDER — OXYCODONE-ACETAMINOPHEN 5-325 MG PO TABS: 1.0000 | ORAL_TABLET | Freq: Four times a day (QID) | ORAL | 0 refills | Status: DC | PRN

## 2022-10-17 MED ORDER — OXYCODONE-ACETAMINOPHEN 5-325 MG PO TABS: 1.0000 | ORAL_TABLET | ORAL | 0 refills | Status: DC | PRN

## 2022-10-17 MED ORDER — HYDROMORPHONE HCL 1 MG/ML IJ SOLN
1.0000 mg | Freq: Once | INTRAMUSCULAR | Status: AC
Start: 2022-10-17 — End: 2022-10-17
  Administered 2022-10-17: 1 mg via INTRAMUSCULAR
  Filled 2022-10-17: qty 1

## 2022-10-17 MED ORDER — TAMSULOSIN HCL 0.4 MG PO CAPS: 0.4000 mg | ORAL_CAPSULE | Freq: Every day | ORAL | 0 refills | Status: AC

## 2022-10-17 NOTE — Discharge Instructions (Signed)
Your workup this evening shows that you have a 5 mm kidney stone on the right.  Please take medication as directed.  You may pass this within the next few days, if not, please contact the urologist listed to arrange a follow-up appointment.

## 2022-10-17 NOTE — ED Triage Notes (Signed)
Pt c/o right sided flank pain with urinary "dribbling", pt reports hx of kidney stones, reports pain feels similar

## 2022-10-20 MED FILL — Oxycodone w/ Acetaminophen Tab 5-325 MG: ORAL | Qty: 6 | Status: AC

## 2022-10-20 NOTE — ED Provider Notes (Signed)
[facility] EMERGENCY DEPARTMENT Provider Note   CSN: [id number] Arrival date & time: 10/17/22  1733     History  Chief Complaint  Patient presents with   Flank Pain    Matthew Mckenzie is a 62 y.o. male.   Flank Pain Pertinent negatives include no chest pain, no abdominal pain and no shortness of breath.       Matthew Mckenzie is a 62 y.o. male with past medical history of type 2 diabetes, hypertension, and prior kidney stones who presents to the Emergency Department complaining of right flank pain and urinary "dribbling" symptoms began gradually 2 days ago worse prior to arrival.  He endorses history of kidney stones and reports current pain feels similar.  Pain initiated in his right lower back and is now radiated into his right groin.  He denies nausea, vomiting, fever or chills.  No burning with urination or hematuria.  Home Medications Prior to Admission medications   Medication Sig Start Date End Date Taking? Authorizing Provider  ondansetron (ZOFRAN) 4 MG tablet Take 1 tablet (4 mg total) by mouth every 6 (six) hours. As needed for nausea/vomiting 10/17/22  Yes Annise Boran, PA-C  oxyCODONE-acetaminophen (PERCOCET/ROXICET) 5-325 MG tablet Take 1 tablet by mouth every 4 (four) hours as needed. 10/17/22  Yes Tagen Brethauer, PA-C  oxyCODONE-acetaminophen (PERCOCET/ROXICET) 5-325 MG tablet Take 1 tablet by mouth every 6 (six) hours as needed for severe pain. 10/17/22  Yes Janisse Ghan, PA-C  tamsulosin (FLOMAX) 0.4 MG CAPS capsule Take 1 capsule (0.4 mg total) by mouth daily. 10/17/22  Yes Rosemaria Inabinet, PA-C  amoxicillin (AMOXIL) 500 MG capsule Take 1 capsule (500 mg total) by mouth 3 (three) times daily. 05/31/21   Diona Browner, DMD  aspirin 81 MG tablet Take 81 mg by mouth daily.    [provider]  Aspirin-Acetaminophen-Caffeine (GOODY HEADACHE PO) Take 1 packet by mouth daily as needed (mild pain / headache).    [provider]  cholecalciferol (VITAMIN D) 1000  UNITS tablet Take 1,000 Units by mouth daily.    [provider]  gabapentin (NEURONTIN) 300 MG capsule Take 900 mg by mouth 2 (two) times daily.    [provider]  gemfibrozil (LOPID) 600 MG tablet Take 1 tablet (600 mg total) by mouth 2 (two) times daily before a meal. 11/21/16   Stallings, Zoe A, MD  insulin detemir (LEVEMIR) 100 UNIT/ML injection Inject 22 Units into the skin 2 (two) times daily.    [provider]  lisinopril (PRINIVIL,ZESTRIL) 5 MG tablet Take 1 tablet (5 mg total) by mouth daily. 11/12/16   Forrest Moron, MD  metFORMIN (GLUCOPHAGE) 1000 MG tablet TAKE ONE TABLET BY MOUTH TWICE DAILY WITH  A  MEAL 03/23/17   Wardell Honour, MD  pravastatin (PRAVACHOL) 80 MG tablet Take 1 tablet (80 mg total) by mouth at bedtime. 11/12/16   Forrest Moron, MD      Allergies    Poplar bud extract    Review of Systems   Review of Systems  Constitutional:  Negative for appetite change and fever.  Respiratory:  Negative for shortness of breath.   Cardiovascular:  Negative for chest pain.  Gastrointestinal:  Negative for abdominal pain and vomiting.  Genitourinary:  Positive for difficulty urinating, flank pain and penile pain. Negative for decreased urine volume, scrotal swelling and testicular pain.  Musculoskeletal:  Positive for back pain.  Neurological:  Negative for weakness and numbness.    Physical Exam Updated  Vital Signs BP 129/89 (BP Location: Right Arm)   Pulse 75   Temp 98.6 F (37 C) (Oral)   Resp 16   Ht 5\' 6"  (1.676 m)   Wt 102.1 kg   SpO2 95%   BMI 36.32 kg/m  Physical Exam Vitals and nursing note reviewed.  Constitutional:      General: He is not in acute distress.    Appearance: Normal appearance. He is not toxic-appearing.     Comments: Patient uncomfortable appearing  Cardiovascular:     Rate and Rhythm: Normal rate and regular rhythm.     Pulses: Normal pulses.  Pulmonary:     Effort: Pulmonary effort is normal.   Abdominal:     General: There is no distension.     Palpations: Abdomen is soft.     Tenderness: There is no abdominal tenderness. There is no right CVA tenderness or left CVA tenderness.  Musculoskeletal:        General: Normal range of motion.  Skin:    General: Skin is warm.     Capillary Refill: Capillary refill takes less than 2 seconds.     Findings: No rash.  Neurological:     General: No focal deficit present.     Mental Status: He is alert.     Sensory: No sensory deficit.     Motor: No weakness.     ED Results / Procedures / Treatments   Labs (all labs ordered are listed, but only abnormal results are displayed) Labs Reviewed  URINALYSIS, ROUTINE W REFLEX MICROSCOPIC - Abnormal; Notable for the following components:      Result Value   Hgb urine dipstick SMALL (*)    All other components within normal limits  BASIC METABOLIC PANEL - Abnormal; Notable for the following components:   Sodium 133 (*)    Glucose, Bld 158 (*)    All other components within normal limits  CBC WITH DIFFERENTIAL/PLATELET    EKG None  Radiology CT Renal Stone Study  Result Date: 10/17/2022 CLINICAL DATA:  Right-sided flank pain with urinary dribbling. History of kidney stones. EXAM: CT ABDOMEN AND PELVIS WITHOUT CONTRAST TECHNIQUE: Multidetector CT imaging of the abdomen and pelvis was performed following the standard protocol without IV contrast. RADIATION DOSE REDUCTION: This exam was performed according to the departmental dose-optimization program which includes automated exposure control, adjustment of the mA and/or kV according to patient size and/or use of iterative reconstruction technique. COMPARISON:  10/20/2010 FINDINGS: Lower chest: No acute abnormality. Hepatobiliary: No focal liver abnormality is seen. No gallstones, gallbladder wall thickening, or biliary dilatation. Pancreas: Unremarkable. No pancreatic ductal dilatation or surrounding inflammatory changes. Spleen: Normal in  size without focal abnormality. Adrenals/Urinary Tract: Moderate right hydroureteronephrosis upstream from a 5 mm stone in the right UVJ. No left urinary calculi or hydronephrosis. Bladder is not distended. Stomach/Bowel: Colonic diverticulosis without diverticulitis. Moderate colonic stool load. Normal caliber large and small bowel. Normal appendix. Vascular/Lymphatic: Aortic atherosclerosis. No enlarged abdominal or pelvic lymph nodes. Reproductive: Status post hysterectomy. No adnexal masses. Other: No free intraperitoneal air. Small fat containing left inguinal hernia. Musculoskeletal: No fracture is seen. IMPRESSION: Moderate right hydroureteronephrosis upstream from a 5 mm stone at the right UVJ. Electronically Signed   By: 12/19/2010 M.D.   On: 10/17/2022 20:26     Procedures Procedures    Medications Ordered in ED Medications  HYDROmorphone (DILAUDID) injection 1 mg (1 mg Intramuscular Given 10/17/22 2202)  ondansetron (ZOFRAN-ODT) disintegrating tablet 4 mg (4 mg  Oral Given 10/17/22 2202)    ED Course/ Medical Decision Making/ A&P                           Medical Decision Making Patient with history of recurrent kidney stones gradually worsening right flank and low back pain for 2 days, pain radiated to right groin and worsened in severity today.  Endorses some urinary hesitancy.  No vomiting or fever.  Reports pain feels similar to prior kidney stones.  On my exam, patient nontoxic-appearing but appears uncomfortable.  No CVA tenderness or abdominal tenderness on exam.  Clinical suspicion is high for kidney stone.  Differential would also include musculoskeletal pain, GU process including testicular torsion, neoplasm, appendicitis  Amount and/or Complexity of Data Reviewed Labs: ordered.    Details: Labs interpreted by me, no evidence of leukocytosis, chemistries without derangement, urinalysis shows small amount of blood without evidence of infection Radiology: ordered.     Details: CT renal stone study ordered for further evaluation of patient's symptoms.  Shows moderate right hydroureteronephrosis with 5 mm stone at the right UVJ Discussion of management or test interpretation with external provider(s): Patient with right-sided kidney stone seen on CT.  Medicated here and on recheck patient reports pain is much improved. Appears appropriate for discharge home, follow-up information for local urology, prescriptions written for Flomax, Zofran, and short course of pain medication.  Risk Prescription drug management.           Final Clinical Impression(s) / ED Diagnoses Final diagnoses:  Kidney stone    Rx / DC Orders ED Discharge Orders          Ordered    tamsulosin (FLOMAX) 0.4 MG CAPS capsule  Daily        10/17/22 2303    oxyCODONE-acetaminophen (PERCOCET/ROXICET) 5-325 MG tablet  Every 4 hours PRN        10/17/22 2303    ondansetron (ZOFRAN) 4 MG tablet  Every 6 hours        10/17/22 2303    oxyCODONE-acetaminophen (PERCOCET/ROXICET) 5-325 MG tablet  Every 6 hours PRN        10/17/22 2305              Pauline Aus, PA-C 10/20/22 1740    Bethann Berkshire, MD 10/20/22 1740

## 2023-11-06 ENCOUNTER — Ambulatory Visit (INDEPENDENT_AMBULATORY_CARE_PROVIDER_SITE_OTHER): Payer: Medicaid Other

## 2023-11-06 ENCOUNTER — Ambulatory Visit (HOSPITAL_COMMUNITY)
Admission: EM | Admit: 2023-11-06 | Discharge: 2023-11-06 | Disposition: A | Discharge status: Home / Self Care | Payer: Medicaid Other

## 2023-11-06 ENCOUNTER — Encounter (HOSPITAL_COMMUNITY): Payer: Self-pay

## 2023-11-06 DIAGNOSIS — R051 Acute cough: Secondary | ICD-10-CM | POA: Diagnosis not present

## 2023-11-06 DIAGNOSIS — J22 Unspecified acute lower respiratory infection: Secondary | ICD-10-CM | POA: Diagnosis not present

## 2023-11-06 LAB — POCT INFLUENZA A/B
Influenza A, POC: NEGATIVE
Influenza B, POC: NEGATIVE

## 2023-11-06 MED ORDER — IPRATROPIUM-ALBUTEROL 0.5-2.5 (3) MG/3ML IN SOLN
3.0000 mL | Freq: Once | RESPIRATORY_TRACT | Status: AC
  Administered 2023-11-06: 3 mL via RESPIRATORY_TRACT

## 2023-11-06 MED ORDER — LEVOFLOXACIN 500 MG PO TABS: 500.0000 mg | ORAL_TABLET | Freq: Every day | ORAL | 0 refills | Status: AC

## 2023-11-06 MED ORDER — ALBUTEROL SULFATE HFA 108 (90 BASE) MCG/ACT IN AERS
1.0000 | INHALATION_SPRAY | Freq: Once | RESPIRATORY_TRACT | Status: AC
  Administered 2023-11-06: 2 via RESPIRATORY_TRACT

## 2023-11-06 MED ORDER — IPRATROPIUM-ALBUTEROL 0.5-2.5 (3) MG/3ML IN SOLN
RESPIRATORY_TRACT | Status: AC
  Filled 2023-11-06: qty 3

## 2023-11-06 MED ORDER — PREDNISONE 20 MG PO TABS: 40.0000 mg | ORAL_TABLET | Freq: Every day | ORAL | 0 refills | Status: AC

## 2023-11-06 MED ORDER — ALBUTEROL SULFATE (2.5 MG/3ML) 0.083% IN NEBU: 2.5000 mg | INHALATION_SOLUTION | Freq: Four times a day (QID) | RESPIRATORY_TRACT | 0 refills | Status: AC | PRN

## 2023-11-06 MED ORDER — ALBUTEROL SULFATE HFA 108 (90 BASE) MCG/ACT IN AERS
INHALATION_SPRAY | RESPIRATORY_TRACT | Status: AC
  Filled 2023-11-06: qty 6.7

## 2023-11-06 NOTE — ED Triage Notes (Signed)
Sob, chest discomfort x 5 days. Patient having a dry cough, nasal congestion. Patient's son is sick and they both tested negative for COVID.   Patient tried Theraflu with no relief.

## 2023-11-06 NOTE — Discharge Instructions (Addendum)
Your imaging showed a potential pneumonia.  I am covering you with antibiotics.  Start the steroids today and then take them daily with breakfast.  Use your albuterol inhaler every 6 hours as needed for any wheezing or shortness of breath.  For congestion you can take 1200 mg of Mucinex daily.  Please cut back on your smoking.  Sleeping with a humidifier may help loosen up any secretions in addition to at least 64 ounces of water daily.  If you do not have any improvement over the next 72 hours, develop worsening shortness of breath, or any new concerning symptoms please seek further evaluation at the nearest emergency department.  Return to clinic or follow-up with your primary care provider in the next week or 2 for a repeat chest x-ray to ensure improvement.

## 2023-11-06 NOTE — ED Provider Notes (Signed)
[facility]    CSN: [id number] Arrival date & time: 11/06/23  9528      History   Chief Complaint Chief Complaint  Patient presents with   Cough    HPI Matthew Mckenzie is a 63 y.o. male.   Patient presents to clinic with shortness of breath, cough, hot and cold chills, nasal congestion and rhinorrhea for the past 5 days.  His son and sister are sick with similar symptoms.  Tested negative for COVID-19 at home.  Has tried TheraFlu without much relief.  On the way over to the clinic he was hot and sweaty, did not wear a jacket and only wear them white T-shirt because he was so hot.  It is 20 degrees outside.  Denies any history of COPD.  He has smoked a pack per day for the past 47 years. Reports asthma in his childhood.   The history is provided by the patient and medical records.  Cough   Past Medical History:  Diagnosis Date   Cataract    Diabetes mellitus without complication (HCC)    GERD (gastroesophageal reflux disease)    History of kidney stones    Hypertension    Mixed hyperlipidemia    Neuropathy    Pneumonia    Sleep apnea     Patient Active Problem List   Diagnosis Date Noted   Type II or unspecified type diabetes mellitus without mention of complication, not stated as uncontrolled 08/23/2013   Other and unspecified hyperlipidemia 08/23/2013   Grief reaction 08/23/2013   Essential hypertension, benign 08/23/2013   Obesity, morbid (HCC) 08/23/2013    Past Surgical History:  Procedure Laterality Date   ALVEOPLASTY N/A 05/31/2021   Procedure: ALVEOPLASTY;  Surgeon: Ocie Doyne, DMD;  Location: [facility] OR;  Service: Oral Surgery;  Laterality: N/A;   COLONOSCOPY     ESOPHAGOGASTRODUODENOSCOPY     EYE SURGERY     TOOTH EXTRACTION N/A 05/31/2021   Procedure: DENTAL RESTORATION/EXTRACTIONS OF TEETH NUMBER TWO, THREE, FOUR, FIVE, SIX, SEVEN, EIGHT, NINE, TEN, ELEVEN, TWELVE, THIRTEEN, SEVENTEEN, EIGHTEEN, NINETEEN, TWENTY, TWENTY-ONE, TWENTY-TWO,  TWENTY-THREE, TWENTY-FOUR, TWENTY-FIVE, TWENTY-SIX, TWENTY-SEVEN, TWENTY-EIGHT, TWENTY-NINE, THIRTY, THIRTY-ONE AND REMOVAL OF BILATERAL MANDIBULAR LINGUAL TORI;  Surgeon: Ocie Doyne, DMD;  [place]       Home Medications    Prior to Admission medications   Medication Sig Start Date End Date Taking? Authorizing Provider  albuterol (PROVENTIL) (2.5 MG/3ML) 0.083% nebulizer solution Take 3 mLs (2.5 mg total) by nebulization every 6 (six) hours as needed for wheezing or shortness of breath. 11/06/23  Yes Christian Borgerding, [place] N, FNP  aspirin 81 MG tablet Take 81 mg by mouth daily.   Yes [provider]  atorvastatin (LIPITOR) 40 MG tablet Take 40 mg by mouth daily. 06/16/23  Yes [provider]  cyanocobalamin (VITAMIN B12) 500 MCG tablet Take 2 tablets by mouth daily. 08/06/23  Yes [provider]  gabapentin (NEURONTIN) 300 MG capsule Take 900 mg by mouth 2 (two) times daily.   Yes [provider]  insulin detemir (LEVEMIR) 100 UNIT/ML injection Inject 22 Units into the skin 2 (two) times daily.   Yes [provider]  levofloxacin (LEVAQUIN) 500 MG tablet Take 1 tablet (500 mg total) by mouth daily. 11/06/23  Yes Rinaldo Ratel, [place] N, FNP  lisinopril (PRINIVIL,ZESTRIL) 5 MG tablet Take 1 tablet (5 mg total) by mouth daily. 11/12/16  Yes Stallings, Zoe A, MD  metFORMIN (GLUCOPHAGE-XR) 500 MG 24 hr tablet Take 1,000 mg by mouth  2 (two) times daily with a meal. 11/03/23  Yes [provider]  OMEGA-3-ACID ETHYL ESTERS PO Take 1,000 mg by mouth in the morning and at bedtime. 09/15/23  Yes [provider]  OMEPRAZOLE PO Take 20 mg by mouth with breakfast, with lunch, and with evening meal. 04/17/23  Yes [provider]  Oxcarbazepine (TRILEPTAL) 300 MG tablet Take 750 mg by mouth in the morning, at noon, and at bedtime.   Yes [provider]  pioglitazone (ACTOS) 30 MG tablet Take 30 mg by mouth daily. 07/02/23  Yes [provider]  predniSONE (DELTASONE) 20 MG tablet Take 2 tablets (40 mg total) by mouth daily for 5 days. 11/06/23 11/11/23 Yes Rinaldo Ratel, [place] N, FNP  Semaglutide,0.25 or 0.5MG /DOS, 2 MG/3ML SOPN Inject into the skin. 09/15/23  Yes [provider]  tamsulosin (FLOMAX) 0.4 MG CAPS capsule Take 1 capsule (0.4 mg total) by mouth daily. 10/17/22  Yes Triplett, Tammy, PA-C  Tiotropium Bromide-Olodaterol (STIOLTO RESPIMAT) 2.5-2.5 MCG/ACT AERS Inhale 2 puffs into the lungs daily at 12 noon.   Yes [provider]    Family History History reviewed. No pertinent family history.  Social History Social History   Tobacco Use   Smoking status: Every Day    Current packs/day: 1.00    Average packs/day: 1 pack/day for 45.0 years (45.0 ttl pk-yrs)    Types: Cigarettes   Smokeless tobacco: Never  Vaping Use   Vaping status: Never Used  Substance Use Topics   Alcohol use: Yes    Alcohol/week: 2.0 standard drinks of alcohol    Types: 2 Cans of beer per week    Comment: 1/2 pint- 2 times a week   Drug use: No     Allergies   Insulin glargine and Poplar bud extract   Review of Systems Review of Systems  Per HPI  Physical Exam Triage Vital Signs ED Triage Vitals  Encounter Vitals Group     BP 11/06/23 0914 110/71     Systolic BP Percentile --      Diastolic BP Percentile --      Pulse Rate 11/06/23 0914 81     Resp 11/06/23 0914 (!) 22     Temp 11/06/23 0914 98.3 F (36.8 C)     Temp Source 11/06/23 0914 Oral     SpO2 11/06/23 0914 92 %     Weight 11/06/23 0914 210 lb (95.3 kg)     Height 11/06/23 0914 5\' 6"  (1.676 m)     Head Circumference --      Peak Flow --      Pain Score 11/06/23 0904 9     Pain Loc --      Pain Education --      Exclude from Growth Chart --    No data found.  Updated Vital Signs BP 110/71 (BP Location: Right Arm)   Pulse 81   Temp 98.3 F (36.8 C) (Oral)   Resp (!) 22   Ht 5\' 6"  (1.676 m)   Wt 210 lb (95.3 kg)   SpO2 92%    BMI 33.89 kg/m   Visual Acuity Right Eye Distance:   Left Eye Distance:   Bilateral Distance:    Right Eye Near:   Left Eye Near:    Bilateral Near:     Physical Exam Vitals and nursing note reviewed.  Constitutional:      Appearance: Normal appearance.  HENT:     Head: Normocephalic and atraumatic.  Right Ear: External ear normal.     Left Ear: External ear normal.     Nose: Congestion and rhinorrhea present.     Mouth/Throat:     Mouth: Mucous membranes are moist.  Eyes:     Conjunctiva/sclera: Conjunctivae normal.  Cardiovascular:     Rate and Rhythm: Normal rate and regular rhythm.     Heart sounds: Normal heart sounds. No murmur heard. Pulmonary:     Effort: Pulmonary effort is normal.     Breath sounds: Decreased air movement present.  Musculoskeletal:        General: Normal range of motion.     Cervical back: Normal range of motion.  Skin:    General: Skin is warm and dry.  Neurological:     General: No focal deficit present.     Mental Status: He is alert and oriented to person, place, and time.  Psychiatric:        Mood and Affect: Mood normal.        Behavior: Behavior normal. Behavior is cooperative.      UC Treatments / Results  Labs (all labs ordered are listed, but only abnormal results are displayed) Labs Reviewed  POCT INFLUENZA A/B    EKG   Radiology DG Chest 2 View Result Date: 11/06/2023 CLINICAL DATA:  cough, SOB EXAM: CHEST - 2 VIEW COMPARISON:  Chest radiograph 10/21/2010 FINDINGS: No pleural effusion. No pneumothorax. Normal cardiac and mediastinal contours. There is a hazy opacity at the right lung apex, which could be infectious or artifactual. Vertebral body heights are maintained. Visualized upper abdomen is unremarkable. No radiographically apparent displaced rib fractures. IMPRESSION: Hazy opacity at the right lung apex could be infectious or represent superimposition artifact. If cross sectional imaging is not performed, a  repeat chest radiograph is recommended after resolution of patient's current symptoms. Electronically Signed   By: Lorenza Cambridge M.D.   On: 11/06/2023 10:04    Procedures Procedures (including critical care time)  Medications Ordered in UC Medications  albuterol (VENTOLIN HFA) 108 (90 Base) MCG/ACT inhaler 1-2 puff (has no administration in time range)  ipratropium-albuterol (DUONEB) 0.5-2.5 (3) MG/3ML nebulizer solution 3 mL (3 mLs Nebulization Given 11/06/23 0933)  ipratropium-albuterol (DUONEB) 0.5-2.5 (3) MG/3ML nebulizer solution 3 mL (3 mLs Nebulization Given 11/06/23 0953)    Initial Impression / Assessment and Plan / UC Course  I have reviewed the triage vital signs and the nursing notes.  Pertinent labs & imaging results that were available during my care of the patient were reviewed by me and considered in my medical decision making (see chart for details).  Vitals and triage reviewed, patient is tachypneic and borderline hypoxic upon presentation.  EKG shows normal sinus rhythm at a rate of 80 bpm, no ST elevation or ST depression as well as regular rate and rhythm.  Patient reports improvement after breathing treatment, no wheezing or rails heard but does have diminished air movement.  Suspect some aspect of chronic lung disease such as COPD due to pack per day 47-year smoking history.  Given second DuoNeb with continued improvement.  Oxygenation remains low 90s.  Chest x-ray was suspicious for potential pneumonia, will cover for COPD exacerbation and pneumonia with Levaquin, steroid burst and albuterol neb treatments.  Mid to low 90s may be baseline due to smoking history.  Strict emergency precautions given if symptoms do not improve or they evolve.  Patient verbalized understanding, no questions at this time.     Final Clinical Impressions(s) /  UC Diagnoses   Final diagnoses:  Acute cough  Lower respiratory infection (e.g., bronchitis, pneumonia, pneumonitis, pulmonitis)      Discharge Instructions      Your imaging showed a potential pneumonia.  I am covering you with antibiotics.  Start the steroids today and then take them daily with breakfast.  Use your albuterol inhaler every 6 hours as needed for any wheezing or shortness of breath.  For congestion you can take 1200 mg of Mucinex daily.  Please cut back on your smoking.  Sleeping with a humidifier may help loosen up any secretions in addition to at least 64 ounces of water daily.  If you do not have any improvement over the next 72 hours, develop worsening shortness of breath, or any new concerning symptoms please seek further evaluation at the nearest emergency department.  Return to clinic or follow-up with your primary care provider in the next week or 2 for a repeat chest x-ray to ensure improvement.      ED Prescriptions     Medication Sig Dispense Auth. Provider   levofloxacin (LEVAQUIN) 500 MG tablet Take 1 tablet (500 mg total) by mouth daily. 5 tablet Rinaldo Ratel, [place] N, [place]   predniSONE (DELTASONE) 20 MG tablet Take 2 tablets (40 mg total) by mouth daily for 5 days. 10 tablet Rinaldo Ratel, [place] N, FNP   albuterol (PROVENTIL) (2.5 MG/3ML) 0.083% nebulizer solution Take 3 mLs (2.5 mg total) by nebulization every 6 (six) hours as needed for wheezing or shortness of breath. 75 mL Olie Scaffidi, [place] N, [place]      PDMP not reviewed this encounter.   Analiese Krupka, [place] N, [place] 11/06/23 1028
